# Patient Record
Sex: Female | Born: 1935 | Race: White | Hispanic: No | Smoking: Never smoker
Health system: Southern US, Community
[De-identification: ages and names within clinical notes are randomized; demographics above are authoritative.]

## PROBLEM LIST (undated history)

## (undated) DIAGNOSIS — I4891 Unspecified atrial fibrillation: Secondary | ICD-10-CM

## (undated) DIAGNOSIS — J449 Chronic obstructive pulmonary disease, unspecified: Secondary | ICD-10-CM

## (undated) DIAGNOSIS — F329 Major depressive disorder, single episode, unspecified: Secondary | ICD-10-CM

## (undated) DIAGNOSIS — E119 Type 2 diabetes mellitus without complications: Secondary | ICD-10-CM

## (undated) DIAGNOSIS — K219 Gastro-esophageal reflux disease without esophagitis: Secondary | ICD-10-CM

## (undated) DIAGNOSIS — H409 Unspecified glaucoma: Secondary | ICD-10-CM

## (undated) DIAGNOSIS — I639 Cerebral infarction, unspecified: Secondary | ICD-10-CM

## (undated) DIAGNOSIS — F32A Depression, unspecified: Secondary | ICD-10-CM

## (undated) DIAGNOSIS — K429 Umbilical hernia without obstruction or gangrene: Secondary | ICD-10-CM

## (undated) DIAGNOSIS — I219 Acute myocardial infarction, unspecified: Secondary | ICD-10-CM

## (undated) DIAGNOSIS — I10 Essential (primary) hypertension: Secondary | ICD-10-CM

## (undated) DIAGNOSIS — E785 Hyperlipidemia, unspecified: Secondary | ICD-10-CM

## (undated) DIAGNOSIS — I509 Heart failure, unspecified: Secondary | ICD-10-CM

## (undated) HISTORY — PX: KNEE ARTHROPLASTY: SHX992

## (undated) HISTORY — PX: REPLACEMENT TOTAL KNEE BILATERAL: SUR1225

---

## 2001-09-20 HISTORY — PX: CARDIAC CATHETERIZATION: SHX172

## 2007-04-13 ENCOUNTER — Ambulatory Visit: Payer: Self-pay | Admitting: Internal Medicine

## 2008-08-27 ENCOUNTER — Inpatient Hospital Stay (HOSPITAL_COMMUNITY): Admission: RE | Admit: 2008-08-27 | Discharge: 2008-08-29 | Payer: Self-pay | Admitting: Orthopedic Surgery

## 2009-01-21 ENCOUNTER — Encounter: Admission: RE | Admit: 2009-01-21 | Discharge: 2009-01-21 | Payer: Self-pay | Admitting: Specialist

## 2009-01-22 ENCOUNTER — Encounter: Admission: RE | Admit: 2009-01-22 | Discharge: 2009-01-22 | Payer: Self-pay | Admitting: Specialist

## 2009-01-28 ENCOUNTER — Encounter: Admission: RE | Admit: 2009-01-28 | Discharge: 2009-01-28 | Payer: Self-pay | Admitting: Specialist

## 2009-02-12 ENCOUNTER — Encounter: Admission: RE | Admit: 2009-02-12 | Discharge: 2009-02-12 | Payer: Self-pay | Admitting: Orthopedic Surgery

## 2011-02-02 NOTE — Assessment & Plan Note (Signed)
[facility]                             PULMONARY OFFICE NOTE   NAME:[name], [name]                          MRN:          [id number]  DATE:04/13/2007                            DOB:          [birth year]    PULMONARY CONSULTATION.   PROBLEM:  An 77 year old woman seen on kind referral from Dr. Shary Decamp in  Pulmonary consultation because of cough and bronchitis.   HISTORY:  Episodic bronchitis over several years.  Cough tends to be  slow to resolve after flare-ups and with persistent cough she had a CT  scan.  She brings the disk and we are contacting [facility] for  the radiology report.  My review does not show a major lesion on  available computer screen.  She had another flare of bronchitis very  recently but feels back to her normal baseline as of today.  She has not  been hospitalized for lung disease.  She noticed the shortness of breath  mostly with brisk walking.  Cough is usually dry.  During her most  recent flare she did have a streak of blood transiently, noting that she  was coughing particularly hard and is on Coumadin.  She has a sample of  Spiriva but forgets to use it and is not clear whether it has done her  any good.  She uses Xopenex HFA through an Aerochamber which has made a  big difference in her ability to manipulate it.   MEDICATION:  Spiriva, Xopenex HFA inhaler used p.r.n.  No medication  allergy.  No other medicines listed.   REVIEW OF SYSTEMS:  Exertional dyspnea, nonproductive cough, occasional  palpitation, slight gradual weight loss, rare ankle edema.  She snores.  Occasionally strangles on pills unless she uses applesauce.  Occasionally coughs while eating or drinking but they say not often.  No  heartburn or recognized reflux.  She sleeps comfortably on one pillow  and says her breathing does not wake her at night.   PAST HISTORY:  1. She is not sure about pneumonia but was told once she had mild      asthma  and mild emphysema.  2. Myocardial infarction treated by Dr. Antoine Poche.  3. Elevated cholesterol.  4. Cerebrovascular accident.  5. No deep vein thrombosis.  6. No diabetes.  7. Skin cancers, basal cells, resected from her face.  8. She is not sure about TB exposure to a cousin who had the disease      very many years ago.  What she remembers of tuberculosis skin tests      in the past were negative.   SOCIAL HISTORY:  1. Never smoked herself but husband had been a smoker for years.  2. She is widowed now, living alone.  3. Children are grown.  4. Husband died of adult respiratory distress syndrome and heart      disease.  5. 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   FAMILY HISTORY:  1. Emphysema.  2. Heart disease.  3. One daughter has had diabetes,  stents and myocardial infarction.   OBJECTIVE:  Weight 141 pounds, BP 118/62, pulse 54, room air saturation  95%.  There is a therapeutic scar across the bridge of her nose from old  skin cancer.  She is alert, a little bit overweight.  LUNG SOUNDS:  Are distinctly clear now with no crackles or rales, no  cough demonstrated while here.  Voice quality is normal without stridor.  Her pharynx is clear, there is no cyanosis, clubbing or edema.  HEART SOUNDS:  Regular without murmur heard.   Vaccination:  She has had pneumococcal vaccine once and does get Flu  vaccine.   CT Scan:  On my review, there is some mild bronchiectatic or atelectatic  change at the left base on CT done April 03, 2007, at [facility].  No  distinct mass or pleural effusion, no obvious acute process.  Radiology  report has been requested.   IMPRESSION:  Recurrent bronchitis, probable bronchiectasis.  In someone  of this age, intermittent reflux is always suspected.   PLAN:  1. We are placing a PPD skin test and will track her for report.  2. Okay to use up her current sample of Spiriva and her rescue Xopenex      inhaler, watching to  see if she thinks they make any difference.  3. Schedule pulmonary function test.  4. Schedule return in 1 month.   I appreciate the chance to meet her.  I would be happy to discuss her  care.     Clinton D. Maple Hudson, MD, Tonny Bollman, FACP  Electronically Signed    CDY/MedQ  DD: 04/15/2007  DT: 04/16/2007  Job #: [id number]   cc:   [name], MD

## 2011-02-02 NOTE — H&P (Signed)
[name] [name], ASA                 ACCOUNT NO.:  [id number]   MEDICAL RECORD NO.:  [id number]          PATIENT TYPE:  INP   LOCATION:  NA                           FACILITY:  [facility]   PHYSICIAN:  Madlyn Frankel. Charlann Boxer, M.D.  DATE OF BIRTH:  [birth year]   DATE OF ADMISSION:  08/27/2008  DATE OF DISCHARGE:                              HISTORY & PHYSICAL   PROCEDURE:  Right total knee replacement.   CHIEF COMPLAINT:  Right knee pain.   HISTORY OF PRESENT ILLNESS:  An 77 year old female with a history of  right knee pain secondary to osteoarthritis.  It has been refractory to  all conservative treatment.   PRIMARY CARE PHYSICIAN:  Dr. [name].   CARDIOLOGIST:  Dr. Dulce Sellar.   PAST MEDICAL HISTORY:  Significant for:  1. Osteoarthritis.  2. Hypertension.  3. Stroke.  4. Atrial fibrillation.  5. Diabetes.  6. Dyslipidemia.  7. Emphysema.   PAST SURGICAL HISTORY:  None.   FAMILY HISTORY:  Stroke, heart disease.   SOCIAL HISTORY:  Widowed.  Primary caregiver will be family in the home,  her daughter, [name] [name].   DRUG ALLERGIES:  PENICILLIN.   CURRENT MEDICATIONS:  1. Aspirin 81 mg daily.  2. Citalopram 10 mg p.o. daily.  3. Fosamax 70 mg 1 p.o. weekly.  4. Glipizide 5 mg 1 daily.  5. Lanoxin 12.5 daily.  6. Micardis 40 mg p.o. daily.  7. Nexium 40 mg p.o. daily.  8. Pravachol 40 mg p.o. q.h.s.  9. Spiriva HandiHaler 18 mcg capsule 1 daily.  10.Triamcinolone ointment t.i.d. to q.i.d. p.r.n.  11.Verapamil 80 mg 1 p.o. b.i.d.  12.Warfarin 4 mg daily.  13.Xopenex 45 mcg activated aerosol inhaler 1 to 2 puffs every 4 to 6      hours as needed.  14.Imdur 30 mg p.o. daily, verify dosage with family.   REVIEW OF SYSTEMS:  GENERAL:  She has chills.  RESPIRATORY:  She has  cough.  MUSCULOSKELETAL:  Joint pain, joint swelling, back pain, morning  stiffness.  Otherwise, see HPI.   PHYSICAL EXAMINATION:  Pulse 72.  Respirations 16.  Blood pressure  124/76.  GENERAL:  Awake,  alert and oriented, well developed, well nourished.  HEENT:  Normocephalic.  NECK:  Supple.  No carotid bruits.  CHEST:  Lungs clear to auscultation bilaterally.  BREASTS:  Deferred.  HEART:  Regular rate and rhythm.  S1 and S2 distinct.  ABDOMEN:  Soft, nontender, nondistended.  Bowel sounds present.  PELVIS:  Stable.  GENITOURINARY:  Deferred.  EXTREMITIES:  Right knee has some persistent swelling and flexion  contracture.  SKIN:  No cellulitis.  NEUROLOGIC:  Intact distal sensibilities.   LABORATORY DATA:  Labs, EKG, chest x-ray, all pending presurgical  testing.   PLAN OF ACTION:  Right knee osteoarthritis.   PLAN OF ACTION:  Right total knee replacement at [facility],  August 27, 2008, by surgeon Dr. Durene Romans.  Risks and complications  were discussed both with patient and daughter, [name] [name].   Postoperative medications were provided today.  She is already on  Coumadin.  Provide a prescription for Robaxin, iron, Colace, and  MiraLax.     ______________________________  Yetta Glassman. [address], [place]      Madlyn Frankel. Charlann Boxer, M.D.  Electronically Signed    BLM/MEDQ  D:  08/23/2008  T:  08/23/2008  Job:  [id number]   cc:   [name], MD  Fax: [phone]   Dr. Dulce Sellar

## 2011-02-02 NOTE — Op Note (Signed)
[name] [name] NO.:  [id number]   MEDICAL RECORD NO.:  [id number]          PATIENT TYPE:  INP   LOCATION:  0009                         FACILITY:  [facility]   PHYSICIAN:  Madlyn Frankel. Charlann Boxer, M.D.  DATE OF BIRTH:  12-01-1921   DATE OF PROCEDURE:  08/27/2008  DATE OF DISCHARGE:                               OPERATIVE REPORT   PREOPERATIVE DIAGNOSIS:  Right knee osteoarthritis.   POSTOPERATIVE DIAGNOSIS:  Right knee osteoarthritis.   PROCEDURE:  Right total knee replacement.   COMPONENTS USED:  A DePuy rotating platform posterior stabilized knee  system, size 2.5 femur, 2 tibia, 12.5 insert and a 35 patellar button.   SURGEON:  Madlyn Frankel. Charlann Boxer, M.D.   ASSISTANT:  Dwyane Luo, PA-C   ANESTHESIA:  Spinal.   COMPLICATIONS:  None.   SPECIMENS:  None.   DRAINS:  One Hemovac.   TOURNIQUET TIME:  40 minutes at 250 mmHg.   BLOOD LOSS:  Minimal.   INDICATIONS FOR PROCEDURE:  [name] [name] is an 77 year old female who has  had advanced right knee osteoarthritis.  She had been trying to put off  surgery as much as she possibly could, but her quality of life had  significantly diminished.   Despite the risks that were outlined of infection, DVT, component  failure, need for revision as well as perioperative comorbidity issues,  she wished to proceed with knee replacement for the benefit of pain  relief.  I discussed the postop course expectations.  Consent was  obtained.   PROCEDURE IN DETAIL:  The patient was brought to operative theater.  Once adequate anesthesia, preoperative antibiotics, Ancef administered,  the patient was positioned supine with the right thigh tourniquet  placed.  The right lower extremity was prescrubbed, prepped and draped  in sterile fashion.  Following the time-out identifying the patient and  extremity, the leg was exsanguinated, tourniquet elevated.  Midline  incision was made followed by median partial arthrotomy without a  significant partial medial peel due valgus nature of the knee.  Following initial debridement, attention was directed to the patella.  Precut measure was 22 mm.  I resected down to 14 mm and used a 35  patellar button that fit best on the cut surface of the patella.   Following this, I placed a metal shim on this cut surface to protect the  cut surface from the retractors and the saw blades.   Attention was now directed to the femur.  Following debridement of  meniscus and cruciate stumps, the starting drill opened up the proximal  femur.  I irrigated the canal to prevent fat emboli.  I then placed an  intramedullary rod into the femur and at 3 degrees of valgus I resected  10 mm of bone.  Despite the fact that she had a valgus knee, there was  bone cut of approximately 2 mm made laterally.   Following this distal cut, I attended the tibia.  The tibia subluxated  anteriorly and using extramedullary guide I chose to resect 2 mm bone  off the proximal lateral tibia, the most  affected side.  When this was  done, I checked with my extension block and found the knee was going to  come out to at least extension.  She was still tight lateral.  At this  point I took the opportunity to release the lateral proximal tissues as  well as debrided osteophytes on the lateral distal femur.   I checked the cut surface to make sure my cut was perpendicular and once  I was confident that it was, I set the rotation. I used the rotation  blocks and smooth pins to set the rotation of the femur based on the  proximal cut of the tibia.  These pins were exchanged for 2.5 cutting  block which was noted to be the size of the femur based on the sizing.  The anterior, posterior and chamfer cuts were then made without  difficulty or notching.  Final box cut was made on the lateral aspect of  distal femur.   Following the box cut, attention was redirected to the tibia.  I  determined that the cut surface to the  tibia would be best fit with a  2.5 tibial tray.   It was pinned into the medial third of the tibial tubercle, then drilled  and keel punched.   The trial reduction was now carried out with 2.5 femur, 2.5 tibia and a  10 mm insert.  I felt at this point there was still a little gap opening  medially indicating tightness laterally but also a little bit of  hyperextension.  For this reason I chose a 12.5 insert.  With this, the  knee came to full extension, felt more stable medially and was stable  from extension to flexion.  At this point all trial components were  removed.  We irrigated the knee with normal saline solution.  I injected  the synovial capsule junction with 60 mL of 0.25% Marcaine with  epinephrine and 1 mL of Toradol.   Cement was mixed and the final components were opened.  The final  components were cemented into position.  The knee was brought to  extension with a 12.5 insert.  Extruded cement was removed.   Once the cement was removed and the cement cured, excess cement was  removed from the posterior aspect of the knee.  The final 12.5 insert  was then inserted.  The knee was very stable from extension and flexion  with good tracking of the patella without application of pressure.  A  medium Hemovac drain was placed deep and we reirrigated the knee.  Extensor mechanism was then reapproximated using a #1 Vicryl with the  knee in flexion.  The remainder of the wound was closed using 2-0 Vicryl  and running 4-0 Monocryl.  The knee was cleaned, dried, and dressed  sterilely with a bulky sterile wrap.  The patient was taken to recovery  room in stable condition.     Madlyn Frankel Charlann Boxer, M.D.  Electronically Signed    MDO/MEDQ  D:  08/27/2008  T:  08/27/2008  Job:  [id number]

## 2011-02-05 NOTE — Discharge Summary (Signed)
[name], [name] NO.:  [id number]   MEDICAL RECORD NO.:  [id number]          PATIENT TYPE:  INP   LOCATION:  1603                         FACILITY:  [facility]   PHYSICIAN:  Madlyn Frankel. Charlann Boxer, M.D.  DATE OF BIRTH:  [birth year]   DATE OF ADMISSION:  08/27/2008  DATE OF DISCHARGE:  08/29/2008                               DISCHARGE SUMMARY   ADMITTING DIAGNOSES:  1. Osteoarthritis.  2. Hypertension.  3. Stroke.  4. Atrial fibrillation.  5. Diabetes.  6. Dyslipidemia.  7. Emphysema.   DISCHARGE DIAGNOSES:  1. Osteoarthritis.  2. Hypertension.  3. Stroke.  4. Atrial fibrillation.  5. Diabetes.  6. Dyslipidemia.  7. Emphysema.   HISTORY OF PRESENT ILLNESS:  An 77 year old female with a history of  right knee pain secondary to osteoarthritis.  It was refractory to all  conservative treatment.  Admitted to the hospital for a right total knee  replacement.   PROCEDURE:  A right total knee replacement by surgeon, Dr. Durene Romans,  and assistant, Dwyane Luo P.A.-C.   CONSULTS:  None.   LABORATORY DATA:  CBC, final reading showed her white blood cells 7.1,  hemoglobin 10.4, hematocrit 31.6, and platelets 285.  White cell  differential all within normal limits.  Coags, PT 18.2 and her INR was  1.4.  Metabolic panel, final reading, sodium 129, potassium 3.9, glucose  144, and creatinine 0.6.  Calcium 8.  UA negative.   RADIOLOGY:  Chest, 2 view, showed COPD, bibasilar scarring, no active  disease.   HOSPITAL COURSE:  Patient underwent a right total knee replacement,  tolerated procedure well, admitted to the orthopedic floor.  Her stay  was unremarkable.  She remained hemodynamically and orthopedically  stable throughout her course of stay.  Coumadin was started for DVT  prophylaxis, as well as chronic atrial fibrillation.  There were no  complications during her course of stay.  Physical therapy was  weightbearing as tolerated.  She made good progress with  her therapy.  When we saw her on September 29, 2008, she was stable and improved and  ready for discharge.   DISCHARGE DISPOSITION:  Discharged home with home health care, PT, in  stable and improved condition.   DISCHARGE PHYSICAL THERAPY:  Weightbearing as tolerated with the use of  rolling walker.   DISCHARGE DIET:  Heart healthy.   DISCHARGE WOUND CARE:  Keep dry.   DISCHARGE MEDICATIONS:  1. Coumadin 6 mg for 2 days and then resume 4 mg daily.  2. Robaxin 500 mg p.o. q.6  3. Celebrex 200 mg p.o. b.i.d.  4. Iron 325 mg p.o. t.i.d.  5. Colace 100 mg p.o. b.i.d.  6. MiraLax 17 g p.o. daily.  7. Norco 7.5/325 one to two q. 4 to 6.  8. Citalopram 10 mg p.o. daily.  9. Glipizide 5 mg p.o. daily.  10.Lanoxin 0.125 mg Monday, Wednesday, Friday.  11.Micardis 40 mg p.o. daily.  12.Nexium 40 mg p.o. daily.  13.Pravachol 40 mg p.o. q.h.s.  14.Spiriva daily.  15.Triamcinolone cream p.r.n.  16.Verapamil 80 mg 1 p.o. b.i.d.  DISCHARGE FOLLOWUP:  Follow up with [name] at phone number [phone] in  2 weeks for wound check.     ______________________________  [name] [name]. [name] [name], [place]      Madlyn Frankel. Charlann Boxer, M.D.  Electronically Signed    BLM/MEDQ  D:  10/01/2008  T:  10/01/2008  Job:  [id number]   cc:   [name], MD  Fax: [phone]   Dr. Dulce Sellar

## 2011-05-05 ENCOUNTER — Other Ambulatory Visit (HOSPITAL_COMMUNITY): Payer: Self-pay | Admitting: Orthopedic Surgery

## 2011-05-05 ENCOUNTER — Ambulatory Visit (HOSPITAL_COMMUNITY)
Admission: RE | Admit: 2011-05-05 | Discharge: 2011-05-05 | Disposition: A | Discharge status: Home / Self Care | Payer: Medicare Other | Source: Ambulatory Visit | Attending: Orthopedic Surgery | Admitting: Orthopedic Surgery

## 2011-05-05 ENCOUNTER — Other Ambulatory Visit: Payer: Self-pay | Admitting: Orthopedic Surgery

## 2011-05-05 ENCOUNTER — Encounter (HOSPITAL_COMMUNITY): Payer: Medicare Other

## 2011-05-05 DIAGNOSIS — Z01818 Encounter for other preprocedural examination: Secondary | ICD-10-CM | POA: Insufficient documentation

## 2011-05-05 DIAGNOSIS — Z01812 Encounter for preprocedural laboratory examination: Secondary | ICD-10-CM | POA: Insufficient documentation

## 2011-05-05 DIAGNOSIS — I2510 Atherosclerotic heart disease of native coronary artery without angina pectoris: Secondary | ICD-10-CM | POA: Insufficient documentation

## 2011-05-05 DIAGNOSIS — E785 Hyperlipidemia, unspecified: Secondary | ICD-10-CM | POA: Insufficient documentation

## 2011-05-05 DIAGNOSIS — J90 Pleural effusion, not elsewhere classified: Secondary | ICD-10-CM | POA: Insufficient documentation

## 2011-05-05 DIAGNOSIS — M1710 Unilateral primary osteoarthritis, unspecified knee: Secondary | ICD-10-CM | POA: Insufficient documentation

## 2011-05-05 DIAGNOSIS — J4489 Other specified chronic obstructive pulmonary disease: Secondary | ICD-10-CM | POA: Insufficient documentation

## 2011-05-05 DIAGNOSIS — E119 Type 2 diabetes mellitus without complications: Secondary | ICD-10-CM | POA: Insufficient documentation

## 2011-05-05 DIAGNOSIS — Z01811 Encounter for preprocedural respiratory examination: Secondary | ICD-10-CM

## 2011-05-05 DIAGNOSIS — Z79899 Other long term (current) drug therapy: Secondary | ICD-10-CM | POA: Insufficient documentation

## 2011-05-05 DIAGNOSIS — J449 Chronic obstructive pulmonary disease, unspecified: Secondary | ICD-10-CM | POA: Insufficient documentation

## 2011-05-05 DIAGNOSIS — I252 Old myocardial infarction: Secondary | ICD-10-CM | POA: Insufficient documentation

## 2011-05-05 LAB — URINALYSIS, ROUTINE W REFLEX MICROSCOPIC
Bilirubin Urine: NEGATIVE
Glucose, UA: NEGATIVE mg/dL
Hgb urine dipstick: NEGATIVE
Ketones, ur: NEGATIVE mg/dL
Nitrite: NEGATIVE
pH: 6.5 (ref 5.0–8.0)

## 2011-05-05 LAB — CBC
HCT: 42 % (ref 36.0–46.0)
Hemoglobin: 13.6 g/dL (ref 12.0–15.0)
MCH: 27.5 pg (ref 26.0–34.0)
MCHC: 32.4 g/dL (ref 30.0–36.0)
MCV: 85 fL (ref 78.0–100.0)
Platelets: 364 10*3/uL (ref 150–400)
RBC: 4.94 MIL/uL (ref 3.87–5.11)
RDW: 15 % (ref 11.5–15.5)
WBC: 6.8 10*3/uL (ref 4.0–10.5)

## 2011-05-05 LAB — DIFFERENTIAL
Basophils Absolute: 0 10*3/uL (ref 0.0–0.1)
Basophils Relative: 0 % (ref 0–1)
Eosinophils Absolute: 0.2 10*3/uL (ref 0.0–0.7)
Eosinophils Relative: 2 % (ref 0–5)
Lymphocytes Relative: 28 % (ref 12–46)
Lymphs Abs: 1.9 10*3/uL (ref 0.7–4.0)
Monocytes Absolute: 0.7 10*3/uL (ref 0.1–1.0)
Monocytes Relative: 11 % (ref 3–12)
Neutro Abs: 4 10*3/uL (ref 1.7–7.7)
Neutrophils Relative %: 59 % (ref 43–77)

## 2011-05-05 LAB — BASIC METABOLIC PANEL
BUN: 13 mg/dL (ref 6–23)
GFR calc Af Amer: >60 mL/min (ref >60)
GFR calc non Af Amer: >60 mL/min (ref >60)
Potassium: 4.4 mEq/L (ref 3.5–5.1)

## 2011-05-05 LAB — URINE MICROSCOPIC-ADD ON

## 2011-05-05 LAB — APTT: aPTT: 46 seconds — ABNORMAL HIGH (ref 24–37)

## 2011-05-17 ENCOUNTER — Inpatient Hospital Stay (HOSPITAL_COMMUNITY)
Admission: RE | Admit: 2011-05-17 | Discharge: 2011-05-20 | DRG: 470 | Disposition: A | Discharge status: Home Health | Payer: Medicare Other | Source: Ambulatory Visit | Attending: Orthopedic Surgery | Admitting: Orthopedic Surgery

## 2011-05-17 DIAGNOSIS — Z8673 Personal history of transient ischemic attack (TIA), and cerebral infarction without residual deficits: Secondary | ICD-10-CM

## 2011-05-17 DIAGNOSIS — E119 Type 2 diabetes mellitus without complications: Secondary | ICD-10-CM | POA: Diagnosis present

## 2011-05-17 DIAGNOSIS — M1710 Unilateral primary osteoarthritis, unspecified knee: Principal | ICD-10-CM | POA: Diagnosis present

## 2011-05-17 DIAGNOSIS — Z01812 Encounter for preprocedural laboratory examination: Secondary | ICD-10-CM

## 2011-05-17 DIAGNOSIS — R339 Retention of urine, unspecified: Secondary | ICD-10-CM | POA: Diagnosis not present

## 2011-05-17 DIAGNOSIS — I252 Old myocardial infarction: Secondary | ICD-10-CM

## 2011-05-17 DIAGNOSIS — K219 Gastro-esophageal reflux disease without esophagitis: Secondary | ICD-10-CM | POA: Diagnosis present

## 2011-05-17 DIAGNOSIS — I2510 Atherosclerotic heart disease of native coronary artery without angina pectoris: Secondary | ICD-10-CM | POA: Diagnosis present

## 2011-05-17 DIAGNOSIS — J449 Chronic obstructive pulmonary disease, unspecified: Secondary | ICD-10-CM | POA: Diagnosis present

## 2011-05-17 DIAGNOSIS — K449 Diaphragmatic hernia without obstruction or gangrene: Secondary | ICD-10-CM | POA: Diagnosis present

## 2011-05-17 DIAGNOSIS — I4891 Unspecified atrial fibrillation: Secondary | ICD-10-CM | POA: Diagnosis present

## 2011-05-17 DIAGNOSIS — J4489 Other specified chronic obstructive pulmonary disease: Secondary | ICD-10-CM | POA: Diagnosis present

## 2011-05-17 DIAGNOSIS — E785 Hyperlipidemia, unspecified: Secondary | ICD-10-CM | POA: Diagnosis present

## 2011-05-17 LAB — PROTIME-INR
INR: 1.05 (ref 0.00–1.49)
Prothrombin Time: 13.9 seconds (ref 11.6–15.2)

## 2011-05-17 LAB — TYPE AND SCREEN: Antibody Screen: NEGATIVE

## 2011-05-17 LAB — GLUCOSE, CAPILLARY: Glucose-Capillary: 148 mg/dL — ABNORMAL HIGH (ref 70–99)

## 2011-05-18 LAB — GLUCOSE, CAPILLARY
Glucose-Capillary: 127 mg/dL — ABNORMAL HIGH (ref 70–99)
Glucose-Capillary: 156 mg/dL — ABNORMAL HIGH (ref 70–99)
Glucose-Capillary: 184 mg/dL — ABNORMAL HIGH (ref 70–99)

## 2011-05-18 LAB — BASIC METABOLIC PANEL
BUN: 7 mg/dL (ref 6–23)
CO2: 26 mEq/L (ref 19–32)
Calcium: 8.5 mg/dL (ref 8.4–10.5)
Glucose, Bld: 150 mg/dL — ABNORMAL HIGH (ref 70–99)
Potassium: 3.6 mEq/L (ref 3.5–5.1)

## 2011-05-18 LAB — CBC
HCT: 33.1 % — ABNORMAL LOW (ref 36.0–46.0)
Hemoglobin: 11.2 g/dL — ABNORMAL LOW (ref 12.0–15.0)
MCH: 28.3 pg (ref 26.0–34.0)
MCHC: 33.8 g/dL (ref 30.0–36.0)
RBC: 3.96 MIL/uL (ref 3.87–5.11)

## 2011-05-19 LAB — CBC
Hemoglobin: 10.5 g/dL — ABNORMAL LOW (ref 12.0–15.0)
MCH: 27.5 pg (ref 26.0–34.0)
MCHC: 32.7 g/dL (ref 30.0–36.0)
Platelets: 267 10*3/uL (ref 150–400)
RDW: 15 % (ref 11.5–15.5)

## 2011-05-19 LAB — GLUCOSE, CAPILLARY
Glucose-Capillary: 220 mg/dL — ABNORMAL HIGH (ref 70–99)
Glucose-Capillary: 144 mg/dL — ABNORMAL HIGH (ref 70–99)

## 2011-05-19 LAB — BASIC METABOLIC PANEL
CO2: 27 mEq/L (ref 19–32)
Chloride: 96 mEq/L (ref 96–112)
Glucose, Bld: 158 mg/dL — ABNORMAL HIGH (ref 70–99)
Potassium: 3.9 mEq/L (ref 3.5–5.1)
Sodium: 129 mEq/L — ABNORMAL LOW (ref 135–145)

## 2011-05-20 LAB — GLUCOSE, CAPILLARY
Glucose-Capillary: 184 mg/dL — ABNORMAL HIGH (ref 70–99)
Glucose-Capillary: 159 mg/dL — ABNORMAL HIGH (ref 70–99)

## 2011-05-21 NOTE — Op Note (Signed)
NAMEKEYLIE, BEAVERS NO.:  [id number]  MEDICAL RECORD NO.:  [id number]  LOCATION:  1612                         FACILITY:  [facility]  PHYSICIAN:  Madlyn Frankel. Charlann Boxer, M.D.  DATE OF BIRTH:  [birth year]  DATE OF PROCEDURE:  05/17/2011 DATE OF DISCHARGE:                              OPERATIVE REPORT   PREOPERATIVE DIAGNOSIS:  Left knee osteoarthritis.  POSTOPERATIVE DIAGNOSIS:  Left knee osteoarthritis.  PROCEDURE:  Left total knee replacement.  COMPONENTS USED:  DePuy rotating platform posterior stabilized knee system with size 2-1/2 femur, 2-1/2 tibia, 10-mm insert and a 35 patellar button.  SURGEON:  Madlyn Frankel. Charlann Boxer, M.D.  ASSISTANT:  Lanney Gins, PA  ANESTHESIA:  General plus a preoperative regional femoral nerve block.  SPECIMENS:  None.  COMPLICATIONS:  None.  DRAINS:  One Hemovac.  TOURNIQUET TIME:  33 minutes at 250 mmHg.  INDICATION FOR PROCEDURE:  [name] is an 77 year old female, who presented to the office with bilateral knee osteoarthritis.  She had successfully undergone a right total knee replacement.  At this point, she was ready to proceed with a left knee replacement.  Risks and benefits were discussed and reviewed as well as a postoperative course and expectations.  Questions were answered.  Consent was obtained for benefit of pain relief.  PROCEDURE IN DETAIL:  The patient was brought to the operative theater. Once adequate anesthesia, preoperative antibiotics, vancomycin administered due to MRSA swab, nasal swab.  She was positioned supine with left thigh tourniquet placed.  Left lower extremity was then prepped and draped in sterile fashion with left foot placed into the [facility] leg holder.  A time-out was performed identifying the patient, planned procedure and extremity.  Leg was exsanguinated, tourniquet elevated to 250 mmHg.  A midline incision was made followed by median parapatellar arthrotomy.  Following initial  exposure of the proximal tibia as well as debridement of synovium and fat pad, attention was first directed to the patella.  The patella was measured to be 22 mm.  I resected down the 14 mm using a 35 patellar button to restore patella thickness and cover  the cut surface.  Lug holes were drilled and a metal shim placed to protect the patella from retractors and saw blades.  At this point, attention was directed to femur.  Femoral canal was opened.  The drill irrigated to try to prevent fat emboli.  An intramedullary rod was passed at 3 degrees of valgus, 10 mm of bone were resected off the distal femur.  Following this resection, the tibia was subluxed anteriorly and following removal of medial and lateral meniscus, the extramedullary guide was positioned and approximately 6 mm of bone resected based off the proximal lateral tibia.  Following this resection, we confirmed the gap would be stable at the 10- mm insert.  I then sized the femur to be a size 2-1/2 femur.  Size 2-1/2 rotation block was then pinned into position anterior reference using the C-clamp set rotation.  The anterior, posterior, and chamfer cuts were then made and a final box cut made up the lateral aspect of the distal femur without complications.  Tibia was then subluxated  anteriorly and cut surface was best fit with size 2-1/2 tibial tray.  The tray was pinned into position through the medial third of the tubercle drilled and keel punched.  Trial reduction was carried out with a 10-mm insert.  The knee came to full extension. The patella tracked through the trochlea without application of pressure.  Given these findings, the trial components removed.  The final components were opened, the cement was mixed.  Final components were cemented onto clean and dried cut surfaces of bone.  The extruded cement was removed from the knee.  Once the cement had fully cured, excessive cement was removed throughout the  knee.  The final 10-mm insert was chosen and placed in the knee.  The tourniquet had been let down after 33 minutes without significant hemostasis required.  A medium Hemovac drain was placed deep.  The extensor mechanism was then reapproximated over a Hemovac drain using #1 Vicryl. The knee in flexion.  The remaining of the wound was closed with 2-0 Vicryl and running 4-0 Monocryl.  The knee was cleaned, dried, and dressed sterilely using Dermabond and Aquacel dressing.  Drain site dressed separately.  The patient was brought to recovery room with an Ace wrap over the knee in stable condition , tolerating the procedure well.     Madlyn Frankel Charlann Boxer, M.D.     MDO/MEDQ  D:  05/17/2011  T:  05/17/2011  Job:  [id number]  Electronically Signed by Durene Romans M.D. on 05/21/2011 09:03:32 AM

## 2011-05-21 NOTE — Discharge Summary (Signed)
NAMENOMA, QUIJAS NO.:  [id number]  MEDICAL RECORD NO.:  [id number]  LOCATION:  1612                         FACILITY:  [facility]  PHYSICIAN:  Madlyn Frankel. Charlann Boxer, M.D.  DATE OF BIRTH:  27-Dec-1921  DATE OF ADMISSION:  05/17/2011 DATE OF DISCHARGE:  05/19/2011                              DISCHARGE SUMMARY   PROCEDURE:  Left total knee arthroplasty.  ATTENDING PHYSICIAN:  Madlyn Frankel. Charlann Boxer, M.D.  ADMITTING DIAGNOSIS:  Left knee osteoarthritis.  DISCHARGE DIAGNOSES: 1. Status post left total knee arthroplasty. 2. Chronic obstructive  pulmonary disease. 3. Atrial fibrillation. 4. Borderline diabetes. 5. Rheumatoid arthritis. 6. Hiatal hernia with reflux. 7. Previous heart attack. 8. Previous small stroke. 9. Hyperlipidemia. 10.Coronary artery disease.  HISTORY OF PRESENT ILLNESS:  The patient is an 77 year old female complaining of left knee pain.  The patient states the pain has been going off and on for about 10 years.  The patient did have a previous right total knee arthroplasty in 2009.  X-rays recently over the left knee shows significant arthritic changes.  The patient has received steroid injections in the past, which have helped some, but only for a while and the duration has decreased since receiving them.  Various options were discussed with the patient.  The patient wished to proceed with surgery.  Risks, benefits and expectations of the procedure were discussed with the patient.  The patient understood the risks, benefits, and expectations and wished to proceed with surgery.  HOSPITAL CHART:  Postop day 1, May 10, 2011, the patient is doing well.  No complaints, afebrile, vital signs stable.  H and H is 11.2/32.1.  Dressing good, clean, dry and intact.  Distally neurovascularly intact.  The patient's Hemovac was discontinued.  IV was changed to saline lock.  The patient had physical therapy.  Postop day 2,  May 19, 2011, the patient is  doing well.  No incidences.  The patient does state she is ready to go home.  The patient did have difficulty urinating overnight, but did urinate this morning.  H and H is 10.5/32.1.  Afebrile, vital signs stable.  Dressing good, clean, dry, and intact.  Distally neurovascularly intact.  The patient was transferred this morning.  The patient does seem out of it with some pain medicine.  Pain medicine was not lowered on discharge. The patient will be working with physical therapy today.  As long as she does well she is good enough to be discharged home.  DISCHARGE CONDITION:  Okay.  DISCHARGE INSTRUCTIONS:  The patient will be discharged home with Home Health, PT, and weightbearing as tolerated.  The patient will maintain her surgical dressing for 7 to 8 days after which time she will be changed with gauze and tape.  This area should stay dry and clean until followup.  The patient will follow up with [name] in 2 weeks at [facility].  The patient knows to call with any questions or concerns.  DISCHARGE MEDICATIONS: 1. Benadryl 25 mg one p.o. q.4 hours p.r.n. 2. Colace 100 mg one p.o. b.i.d. constipation. 3. Iron sulfate 325 mg one p.o. t.i.d. for 2 to 3 weeks. 4. Norco  5/325 one to two p.o. q.4-6 hours p.r.n. pain (the patient     does know she can take this in half, one pill seems too much). 5. Robaxin 500 mg one p.o. q.6 hours p.r.n. muscle spasms. 6. MiraLax 17 g p.o. q. day constipation. 7. __________ 10 mg one p.o. q.a.m. 8. Fish oil one p.o. every Monday, Wednesday, and Friday. 9. Furosemide 40 mg half tablet p.o. every other day. 10.Glipizide 5 mg one p.o. b.i.d. 11.Isosorbide mononitrate XR 60 mg one p.o. q.a.m. 12.Digoxin 0.125 mg one p.o. q.a.m. every Monday, Wednesday, and     Friday. 13.Lumigan 1 drop, both eyes q.h.s. 14.Pradaxa 150 mg one p.o. b.i.d. 15.Pravachol 40 mg one p.o. q.h.s. 16.Saline spray, one  spray nasally p.r.n. 17.Spiriva 18 mcg 1  capsule inhaled daily. 18.Systane 1 to 2 drops in both eyes twice daily as needed. 19.Verapamil 80 mg one p.o. b.i.d. 20.Xopenex inhaler 1 to 2 puffs q.6 hours p.r.n.    ______________________________ Lanney Gins, PA   ______________________________ Madlyn Frankel. Charlann Boxer, M.D.    MB/MEDQ  D:  05/19/2011  T:  05/19/2011  Job:  [id number]  Electronically Signed by Lanney Gins PA on 05/20/2011 11:26:22 AM Electronically Signed by Durene Romans M.D. on 05/21/2011 09:03:38 AM

## 2011-06-04 NOTE — Discharge Summary (Signed)
  NAMEJOVON, [name] [name] NO.:  [id number]  MEDICAL RECORD NO.:  [id number]  LOCATION:  1612                         FACILITY:  [facility]  PHYSICIAN:  Madlyn Frankel. Charlann Boxer, M.D.  DATE OF BIRTH:  01-09-1922  DATE OF ADMISSION:  05/17/2011 DATE OF DISCHARGE:  05/20/2011                              DISCHARGE SUMMARY   ADDENDUM:  After the patient had physical therapy on May 19, 2011, it was felt that the patient may be unsafe to herself to go home.  After discussion with the daughter and mother, the patient stayed 1 extra night.  After physical therapy on May 20, 2011, PT, daughter, and the patient all felt that she was safer to be discharged home.  The patient will be discharged home on May 20, 2011 with Home Health Physical Therapy and still followup at [facility] 2 weeks postop.    ______________________________ Lanney Gins, PA   ______________________________ Madlyn Frankel. Charlann Boxer, M.D.    MB/MEDQ  D:  05/20/2011  T:  05/20/2011  Job:  [id number]  Electronically Signed by Lanney Gins PA on 06/01/2011 03:55:56 PM Electronically Signed by Durene Romans M.D. on 06/04/2011 07:24:46 AM

## 2011-06-04 NOTE — H&P (Signed)
[name] [name], [name] [name] NO.:  [id number]  MEDICAL RECORD NO.:  [id number]  LOCATION:  [facility]M                         FACILITY:  [facility]  PHYSICIAN:  Madlyn Frankel. Charlann Boxer, M.D.  DATE OF BIRTH:  27-Feb-1922  DATE OF ADMISSION:  05/05/2011 DATE OF DISCHARGE:  05/05/2011                             HISTORY & PHYSICAL   DATE OF SURGERY:  May 17, 2011.  DIAGNOSIS:  Left knee osteoarthritis.  HISTORY OF PRESENT ILLNESS:  The patient is an 77 year old female complaining of left knee pain.  The patient says the pain has been going off and on for about 10 years.  The patient did have a previous right total knee arthroplasty in 2009.  X-rays recently of her left knee show a significant osteoarthritis.  The patient has received a steroid injection in the past which have helped some, but only for a while and the duration has decreased since receiving them.  Various options were discussed with the patient.  The patient wished to proceed with surgery. Risks, benefits, and expectations of procedure were discussed with the patient.  The patient understands risks, benefits, and expectations and wished to proceed with surgery.  The patient's primary care physician is Dr. [name], MD.  The patient also sees Dr. Leary Roca in [facility].  PAST MEDICAL HISTORY: 1. COPD. 2. Atrial fib. 3. Borderline diabetes. 4. RA. 5. Hiatal hernia with reflux. 6. Previous light heart attack. 7. Previous small stroke. 8. Hyperlipidemia. 9. CAD.  PAST SURGICAL HISTORY:  Vertebroplasty.  MEDICATIONS: 1. Citalopram 10 mg 1 p.o. daily. 2. Digoxin 0.125 mg one p.o. on Mondays, Wednesdays, and Fridays. 3. Lasix 40 mg half a pill on Monday, Wednesday, Fridays. 4. Glipizide 5 mg one p.o. b.i.d. 5. Isosorbide control release 60 mg one p.o. daily. 6. Phenergan 0.03% ophthalmic solution 1 drop both eyes once daily in     the evenings. 7. Nexium 40 mg one p.o. daily 8. Nitrolingual  Durapak 0.4 mg/spray solution, one spray under the     tongue as needed for chest pain. 9. Pradaxa 150 mg one p.o. b.i.d.  The patient will stop this 48 hours     prior to surgery.  This also will be started 23 hours after     surgery. 10.Pravastatin 40 mg one p.o. q.h.s. 11.Spiriva HandiHaler 18 mcg inhalation capsule inhaled one daily. 12.Triamcinolone 0.1% external ointment to affected area 3-4 times     daily. 13.Verapamil 80 mg one p.o. b.i.d. 14.Xopenex HFA 45 mcg/ACT inhalation inhale 1-2 puffs every 4-6 hours     as needed.  ALLERGIES:  PENICILLINS.  SOCIAL HISTORY:  The patient denies use of alcohol or tobacco.  REVIEW OF SYSTEMS:  The patient does admit to some hearing loss and some insomnia at times.  Also complaining of joint pain, joint swelling, and morning stiffness.  Otherwise review of systems is unremarkable.  PHYSICAL EXAMINATION:  GENERAL:  The patient is an 77 year old female with no acute distress. VITAL SIGNS:  Stable.  Blood pressure is 140/84, respirations 16, pulse 60. HEENT:  Pupils are equal, round, and reactive to light and accommodation.  Throat is clear. NECK: Supple.  No JVD.  There are no carotid bruits.  No lymphadenopathy noted. CARDIAC:  S1, S2.  No murmur appreciated. RESPIRATORY:  Lungs clear to auscultation bilaterally. NEUROLOGIC:  The patient is alert and oriented x3 MUSCULOSKELETAL:  Ortho is pertaining to the left knee.  The patient has no real pain on palpation.  The patient does have a retropatellar crepitus on range of motion.  The patient is lacking couple degrees of full extension.  The patient has a good flexion.  The patient has a valgus deformity to the knee.  The patient has a posterior dorsalis pedis pulse.  Distally neurovascularly intact.  Good sensation to light touch.  Surgical site looks clear.  IMPRESSION:  Left knee osteoarthritis.  STUDIES:  X-rays as above.  PLAN:  The patient admitted to the hospital to undergo  a left total knee replacement per [name] on May 17, 2011.  Risks, benefits, and expectations of procedure discussed with the patient.  The patient understands risks, benefits, and expectations and wished to proceed with surgery.    ______________________________ Lanney Gins, PA   ______________________________ Madlyn Frankel. Charlann Boxer, M.D.    MB/MEDQ  D:  05/05/2011  T:  05/06/2011  Job:  [id number]  Electronically Signed by Lanney Gins PA on 06/01/2011 03:56:12 PM Electronically Signed by Durene Romans M.D. on 06/04/2011 07:24:50 AM

## 2011-06-25 LAB — URINALYSIS, ROUTINE W REFLEX MICROSCOPIC
Bilirubin Urine: NEGATIVE
Hgb urine dipstick: NEGATIVE
Ketones, ur: NEGATIVE mg/dL
Protein, ur: NEGATIVE mg/dL
Specific Gravity, Urine: 1.005 (ref 1.005–1.030)
Urobilinogen, UA: 0.2 mg/dL (ref 0.0–1.0)

## 2011-06-25 LAB — BASIC METABOLIC PANEL
BUN: 7 mg/dL (ref 6–23)
CO2: 26 mEq/L (ref 19–32)
CO2: 26 mEq/L (ref 19–32)
CO2: 28 mEq/L (ref 19–32)
Calcium: 8 mg/dL — ABNORMAL LOW (ref 8.4–10.5)
Calcium: 9.4 mg/dL (ref 8.4–10.5)
Chloride: 100 mEq/L (ref 96–112)
Chloride: 99 mEq/L (ref 96–112)
Creatinine, Ser: 0.6 mg/dL (ref 0.4–1.2)
GFR calc Af Amer: >60 mL/min (ref >60)
GFR calc Af Amer: >60 mL/min (ref >60)
GFR calc non Af Amer: >60 mL/min (ref >60)
Glucose, Bld: 106 mg/dL — ABNORMAL HIGH (ref 70–99)
Glucose, Bld: 144 mg/dL — ABNORMAL HIGH (ref 70–99)
Potassium: 3.9 mEq/L (ref 3.5–5.1)
Potassium: 4 mEq/L (ref 3.5–5.1)
Sodium: 129 mEq/L — ABNORMAL LOW (ref 135–145)
Sodium: 132 mEq/L — ABNORMAL LOW (ref 135–145)
Sodium: 138 mEq/L (ref 135–145)

## 2011-06-25 LAB — CBC
HCT: 31.6 % — ABNORMAL LOW (ref 36.0–46.0)
Hemoglobin: 10.4 g/dL — ABNORMAL LOW (ref 12.0–15.0)
Hemoglobin: 10.7 g/dL — ABNORMAL LOW (ref 12.0–15.0)
Hemoglobin: 13 g/dL (ref 12.0–15.0)
MCHC: 33.4 g/dL (ref 30.0–36.0)
MCHC: 33.6 g/dL (ref 30.0–36.0)
MCV: 87.7 fL (ref 78.0–100.0)
RBC: 3.58 MIL/uL — ABNORMAL LOW (ref 3.87–5.11)
RBC: 3.63 MIL/uL — ABNORMAL LOW (ref 3.87–5.11)
RDW: 13.9 % (ref 11.5–15.5)
RDW: 14 % (ref 11.5–15.5)

## 2011-06-25 LAB — APTT: aPTT: 37 seconds (ref 24–37)

## 2011-06-25 LAB — DIFFERENTIAL
Basophils Absolute: 0 10*3/uL (ref 0.0–0.1)
Basophils Relative: 1 % (ref 0–1)
Eosinophils Relative: 1 % (ref 0–5)
Monocytes Absolute: 0.7 10*3/uL (ref 0.1–1.0)
Neutro Abs: 4.7 10*3/uL (ref 1.7–7.7)

## 2011-06-25 LAB — GLUCOSE, CAPILLARY
Glucose-Capillary: 112 mg/dL — ABNORMAL HIGH (ref 70–99)
Glucose-Capillary: 123 mg/dL — ABNORMAL HIGH (ref 70–99)
Glucose-Capillary: 127 mg/dL — ABNORMAL HIGH (ref 70–99)
Glucose-Capillary: 137 mg/dL — ABNORMAL HIGH (ref 70–99)
Glucose-Capillary: 141 mg/dL — ABNORMAL HIGH (ref 70–99)

## 2011-06-25 LAB — PROTIME-INR
INR: 1 (ref 0.00–1.49)
INR: 1.1 (ref 0.00–1.49)
INR: 1.4 (ref 0.00–1.49)
Prothrombin Time: 13.9 seconds (ref 11.6–15.2)

## 2013-09-29 ENCOUNTER — Encounter (HOSPITAL_COMMUNITY): Payer: Self-pay | Admitting: Emergency Medicine

## 2013-09-29 ENCOUNTER — Emergency Department (HOSPITAL_COMMUNITY): Payer: Medicare PPO

## 2013-09-29 ENCOUNTER — Emergency Department (HOSPITAL_COMMUNITY)
Admission: EM | Admit: 2013-09-29 | Discharge: 2013-09-30 | Disposition: A | Discharge status: Home / Self Care | Payer: Medicare PPO | Attending: Emergency Medicine | Admitting: Emergency Medicine

## 2013-09-29 DIAGNOSIS — S42202A Unspecified fracture of upper end of left humerus, initial encounter for closed fracture: Secondary | ICD-10-CM

## 2013-09-29 DIAGNOSIS — F29 Unspecified psychosis not due to a substance or known physiological condition: Secondary | ICD-10-CM | POA: Insufficient documentation

## 2013-09-29 DIAGNOSIS — S42209A Unspecified fracture of upper end of unspecified humerus, initial encounter for closed fracture: Secondary | ICD-10-CM | POA: Insufficient documentation

## 2013-09-29 DIAGNOSIS — Z7901 Long term (current) use of anticoagulants: Secondary | ICD-10-CM | POA: Insufficient documentation

## 2013-09-29 DIAGNOSIS — W010XXA Fall on same level from slipping, tripping and stumbling without subsequent striking against object, initial encounter: Secondary | ICD-10-CM | POA: Insufficient documentation

## 2013-09-29 DIAGNOSIS — S42009A Fracture of unspecified part of unspecified clavicle, initial encounter for closed fracture: Secondary | ICD-10-CM | POA: Insufficient documentation

## 2013-09-29 DIAGNOSIS — I4891 Unspecified atrial fibrillation: Secondary | ICD-10-CM | POA: Insufficient documentation

## 2013-09-29 DIAGNOSIS — S62319A Displaced fracture of base of unspecified metacarpal bone, initial encounter for closed fracture: Secondary | ICD-10-CM | POA: Insufficient documentation

## 2013-09-29 DIAGNOSIS — S20219A Contusion of unspecified front wall of thorax, initial encounter: Secondary | ICD-10-CM | POA: Insufficient documentation

## 2013-09-29 DIAGNOSIS — S42002A Fracture of unspecified part of left clavicle, initial encounter for closed fracture: Secondary | ICD-10-CM

## 2013-09-29 DIAGNOSIS — S62305A Unspecified fracture of fourth metacarpal bone, left hand, initial encounter for closed fracture: Secondary | ICD-10-CM

## 2013-09-29 DIAGNOSIS — Y92009 Unspecified place in unspecified non-institutional (private) residence as the place of occurrence of the external cause: Secondary | ICD-10-CM | POA: Insufficient documentation

## 2013-09-29 DIAGNOSIS — Y9389 Activity, other specified: Secondary | ICD-10-CM | POA: Insufficient documentation

## 2013-09-29 HISTORY — DX: Acute myocardial infarction, unspecified: I21.9

## 2013-09-29 HISTORY — DX: Type 2 diabetes mellitus without complications: E11.9

## 2013-09-29 HISTORY — DX: Cerebral infarction, unspecified: I63.9

## 2013-09-29 HISTORY — DX: Chronic obstructive pulmonary disease, unspecified: J44.9

## 2013-09-29 HISTORY — DX: Unspecified glaucoma: H40.9

## 2013-09-29 HISTORY — DX: Essential (primary) hypertension: I10

## 2013-09-29 HISTORY — DX: Umbilical hernia without obstruction or gangrene: K42.9

## 2013-09-29 HISTORY — DX: Unspecified atrial fibrillation: I48.91

## 2013-09-29 LAB — URINALYSIS, ROUTINE W REFLEX MICROSCOPIC
GLUCOSE, UA: 100 mg/dL — AB
HGB URINE DIPSTICK: NEGATIVE
Ketones, ur: NEGATIVE mg/dL
Leukocytes, UA: NEGATIVE
Nitrite: NEGATIVE
Protein, ur: NEGATIVE mg/dL
SPECIFIC GRAVITY, URINE: 1.027 (ref 1.005–1.030)
Urobilinogen, UA: 0.2 mg/dL (ref 0.0–1.0)
pH: 6 (ref 5.0–8.0)

## 2013-09-29 MED ORDER — TRAMADOL HCL 50 MG PO TABS
50.0000 mg | ORAL_TABLET | Freq: Once | ORAL | Status: AC
  Administered 2013-09-29: 50 mg via ORAL
  Filled 2013-09-29: qty 1

## 2013-09-29 MED ORDER — TRAMADOL HCL 50 MG PO TABS: 50.0000 mg | ORAL_TABLET | Freq: Four times a day (QID) | ORAL | Status: DC | PRN

## 2013-09-29 NOTE — ED Notes (Addendum)
Patient tolerating fluids well.

## 2013-09-29 NOTE — Discharge Instructions (Signed)
Your urine tests are negative for infection. For shoulder/proximal humerus/clavicle fracture - wear splint/shoulder immobilizer.  For wrist, wear splint.  Ice/coldpack to sore area.  If percocet is too strong - hold/stop that medication.  Instead your may try ultram as need for pain.  Use great care/caution or assistance when up and ambulating.   Follow up with your orthopedist in the coming week - call office Monday morning to arrange that follow up. If you feel you require some additional help at home while your injuries heal, contact your doctors office in morning to discuss home health options.   As the risk of adverse bleeding relating to trauma/fall appears to outweigh the benefit of continued anti-coagulant therapy, hold/stop taking your pradaxa.  Contact your doctor in the next day to discuss whether to discontinue pradaxa permanently given your recent falls and ongoing fall risk.   Return to ER right away if worse, new symptoms, fevers, change in mental status, numbness/weakness, other concern.    Fall Prevention and Home Safety Falls cause injuries and can affect all age groups. It is possible to use preventive measures to significantly decrease the likelihood of falls. There are many simple measures which can make your home safer and prevent falls. OUTDOORS  Repair cracks and edges of walkways and driveways.  Remove high doorway thresholds.  Trim shrubbery on the main path into your home.  Have good outside lighting.  Clear walkways of tools, rocks, debris, and clutter.  Check that handrails are not broken and are securely fastened. Both sides of steps should have handrails.  Have leaves, snow, and ice cleared regularly.  Use sand or salt on walkways during winter months.  In the garage, clean up grease or oil spills. BATHROOM  Install night lights.  Install grab bars by the toilet and in the tub and shower.  Use non-skid mats or decals in the tub or  shower.  Place a plastic non-slip stool in the shower to sit on, if needed.  Keep floors dry and clean up all water on the floor immediately.  Remove soap buildup in the tub or shower on a regular basis.  Secure bath mats with non-slip, double-sided rug tape.  Remove throw rugs and tripping hazards from the floors. BEDROOMS  Install night lights.  Make sure a bedside light is easy to reach.  Do not use oversized bedding.  Keep a telephone by your bedside.  Have a firm chair with side arms to use for getting dressed.  Remove throw rugs and tripping hazards from the floor. KITCHEN  Keep handles on pots and pans turned toward the center of the stove. Use back burners when possible.  Clean up spills quickly and allow time for drying.  Avoid walking on wet floors.  Avoid hot utensils and knives.  Position shelves so they are not too high or low.  Place commonly used objects within easy reach.  If necessary, use a sturdy step stool with a grab bar when reaching.  Keep electrical cables out of the way.  Do not use floor polish or wax that makes floors slippery. If you must use wax, use non-skid floor wax.  Remove throw rugs and tripping hazards from the floor. STAIRWAYS  Never leave objects on stairs.  Place handrails on both sides of stairways and use them. Fix any loose handrails. Make sure handrails on both sides of the stairways are as long as the stairs.  Check carpeting to make sure it is firmly attached along stairs.  Make repairs to worn or loose carpet promptly.  Avoid placing throw rugs at the top or bottom of stairways, or properly secure the rug with carpet tape to prevent slippage. Get rid of throw rugs, if possible.  Have an electrician put in a light switch at the top and bottom of the stairs. OTHER FALL PREVENTION TIPS  Wear low-heel or rubber-soled shoes that are supportive and fit well. Wear closed toe shoes.  When using a stepladder, make sure it  is fully opened and both spreaders are firmly locked. Do not climb a closed stepladder.  Add color or contrast paint or tape to grab bars and handrails in your home. Place contrasting color strips on first and last steps.  Learn and use mobility aids as needed. Install an electrical emergency response system.  Turn on lights to avoid dark areas. Replace light bulbs that burn out immediately. Get light switches that glow.  Arrange furniture to create clear pathways. Keep furniture in the same place.  Firmly attach carpet with non-skid or double-sided tape.  Eliminate uneven floor surfaces.  Select a carpet pattern that does not visually hide the edge of steps.  Be aware of all pets. OTHER HOME SAFETY TIPS  Set the water temperature for 120 F (48.8 C).  Keep emergency numbers on or near the telephone.  Keep smoke detectors on every level of the home and near sleeping areas. Document Released: 08/27/2002 Document Revised: 03/07/2012 Document Reviewed: 11/26/2011 [facility] Patient Information 2014 [place], [place].     Shoulder Fracture (Proximal Humerus or Glenoid) A shoulder fracture is a broken upper arm bone or a broken socket bone. The humerus is the upper arm bone and the glenoid is the shoulder socket. Proximal means the humerus is broken near the shoulder. Most of the time the bones of a broken shoulder are in an acceptable position. Usually, the injury can be treated with a shoulder immobilizer or sling and swath bandage. These devices support the arm and prevent any shoulder movement. If the bones are not in a good position, then surgery is sometimes needed. Shoulder fractures usually initially cause swelling, pain, and discoloration around the upper arm. They heal in 8 to 12 weeks with proper treatment. SYMPTOMS  At the time of injury:  Pain.  Tenderness.  Regular body contours are not normal. Later symptoms may include:  Swelling and bruising of the elbow and  hand.  Swelling and bruising of the arm or chest. Other symptoms include:  Pain when lifting or turning the arm.  Paralysis below the fracture.  Numbness or coldness below the fracture. CAUSES   Indirect force from falling on an outstretched arm.  A blow to the shoulder. RISK INCREASES WITH:  Not being in shape.  Playing contact sports, such as football, soccer, hockey, or rugby.  Sports where falling on an outstretched arm occurs, such as basketball, skateboarding, or volleyball.  History of bone or joint disease.  History of shoulder injury. PREVENTION  Warm up before activity.  Stretch before activity.  Stay in shape with your:  Heart fitness.  Flexibility.  Shoulder Strength.  Falling with the proper technique. PROGNOSIS  In adults, healing time is about 7 weeks. For children, healing time is about 5 weeks. Surgery may be needed. RELATED COMPLICATIONS  The bones do not heal together (nonunion).  The bones do not align properly when they heal (malunion).  Long-term problems with pain, stiffness, swelling, or loss of motion.  The injured arm heals shorter than the  other.  Nerves are injured in the arm.  Arthritis in the shoulder.  Normal bone growth is interrupted in children.  Blood supply to the shoulder joint is diminished. TREATMENT If the bones are aligned, then initial treatment will be with ice and medicine to help with pain. The shoulder will be held in place with a sling (immobilization). The shoulder will be allowed to heal for up to 6 weeks. Injuries that may need surgery include:  Severe fractures.  Fractures that are not in appropriate alignment (displaced).  Non-displaced fractures (not common). Surgery helps the bones align correctly. The bones may be held in place with:  Sutures.  Wires.  Rods.  Plates.  Screws.  Pins. If you have had surgery or not, you will likely be assisted by a physical therapist or athletic  trainer to get the best results with your injured shoulder. This will likely include exercises to strengthen and stretch the injured and surrounding areas. MEDICATION  If pain medicine is needed, nonsteroidal anti-inflammatory medicines (such as aspirin or ibuprofen) or other minor pain relievers (such as acetaminophen) are often advised.  Do not take pain medicine for 7 days before surgery.  Stronger pain relievers may be prescribed. Use only as directed and take only as much as you need. COLD THERAPY Cold treatment (icing) relieves pain and reduces inflammation. Cold treatment should be applied for 10 to 15 minutes every 2 to 3 hours, and immediately after activity that aggravates your symptoms. Use ice packs or an ice massage. SEEK IMMEDIATE MEDICAL CARE IF:  You have severe shoulder pain unrelieved by rest and taking pain medicine.  You have pain, numbness, tingling, or weakness in the hand or wrist.  You have shortness of breath, chest pain, severe weakness, or fainting.  You have severe pain with motion of the fingers or wrist.  Blue, gray, or dark color appears in the fingernails on injured extremity. Document Released: 09/06/2005 Document Revised: 11/29/2011 Document Reviewed: 12/19/2008 [facility] Patient Information 2014 [place], [place].    Clavicle Fracture A clavicle fracture is a break in the collarbone. This is a common injury, especially in children. Collarbones do not harden until around the age of 24. Most collarbone fractures are treated with a simple arm sling. In some cases a figure-of-eight splint is used to help hold the broken bones in position. Although not often needed, surgery may be required if the bone fragments are not in the correct position (displaced).  HOME CARE INSTRUCTIONS   Apply ice to the injury for 15-20 minutes each hour while awake for 2 days. Put the ice in a plastic bag and place a towel between the bag of ice and your skin.  Wear the sling or  splint constantly for as long as directed by your caregiver. You may remove the sling or splint for bathing or showering. Be sure to keep your shoulder in the same place as when the sling or splint is on. Do not lift your arm.  If a figure-of-eight splint is applied, it must be tightened by another person every day. Tighten it enough to keep the shoulders held back. Allow enough room to place the index finger between the body and strap. Loosen the splint immediately if you feel numbness or tingling in your hands.  Only take over-the-counter or prescription medicines for pain, discomfort, or fever as directed by your caregiver.  Avoid activities that irritate or increase the pain for 4 to 6 weeks after surgery.  Follow all instructions for follow-up  with your caregiver. This includes any referrals, physical therapy, and rehabilitation. Any delay in obtaining necessary care could result in a delay or failure of the injury to heal properly. SEEK MEDICAL CARE IF:  You have pain and swelling that are not relieved with medications. SEEK IMMEDIATE MEDICAL CARE IF:  Your arm is numb, cold, or pale, even when the splint is loose. MAKE SURE YOU:   Understand these instructions.  Will watch your condition.  Will get help right away if you are not doing well or get worse. Document Released: 06/16/2005 Document Revised: 11/29/2011 Document Reviewed: 04/11/2008 [facility] Patient Information 2014 [place], [place].   Hand Fracture, Metacarpals Fractures of metacarpals are breaks in the bones of the hand. They extend from the knuckles to the wrist. These bones can undergo many types of fractures. There are different ways of treating these fractures, all of which may be correct. TREATMENT  Hand fractures can be treated with:   Non-reduction - The fracture is casted without changing the positions of the fracture (bone pieces) involved. This fracture is usually left in a cast for 4 to 6 weeks or as your  caregiver thinks necessary.  Closed reduction - The bones are moved back into position without surgery and then casted.  ORIF (open reduction and internal fixation) - The fracture site is opened and the bone pieces are fixed into place with some type of hardware, such as screws, etc. They are then casted. Your caregiver will discuss the type of fracture you have and the treatment that should be best for that problem. If surgery is chosen, let your caregivers know about the following.  LET YOUR CAREGIVERS KNOW ABOUT:  Allergies.  Medications you are taking, including herbs, eye drops, over the counter medications, and creams.  Use of steroids (by mouth or creams).  Previous problems with anesthetics or novocaine.  Possibility of pregnancy.  History of blood clots (thrombophlebitis).  History of bleeding or blood problems.  Previous surgeries.  Other health problems. AFTER THE PROCEDURE After surgery, you will be taken to the recovery area where a nurse will watch and check your progress. Once you are awake, stable, and taking fluids well, barring other problems, you'll be allowed to go home. Once home, an ice pack applied to your operative site may help with pain and keep the swelling down. HOME CARE INSTRUCTIONS   Follow your caregiver's instructions as to activities, exercises, physical therapy, and driving a car.  Daily exercise is helpful for keeping range of motion and strength. Exercise as instructed.  To lessen swelling, keep the injured hand elevated above the level of your heart as much as possible.  Apply ice to the injury for 15-20 minutes each hour while awake for the first 2 days. Put the ice in a plastic bag and place a thin towel between the bag of ice and your cast.  Move the fingers of your casted hand several times a day.  If a plaster or fiberglass cast was applied:  Do not try to scratch the skin under the cast using a sharp or pointed object.  Check the  skin around the cast every day. You may put lotion on red or sore areas.  Keep your cast dry. Your cast can be protected during bathing with a plastic bag. Do not put your cast into the water.  If a plaster splint was applied:  Wear your splint for as long as directed by your caregiver or until seen again.  Do not  get your splint wet. Protect it during bathing with a plastic bag.  You may loosen the elastic bandage around the splint if your fingers start to get numb, tingle, get cold or turn blue.  Do not put pressure on your cast or splint; this may cause it to break. Especially, do not lean plaster casts on hard surfaces for 24 hours after application.  Take medications as directed by your caregiver.  Only take over-the-counter or prescription medicines for pain, discomfort, or fever as directed by your caregiver.  Follow-up as provided by your caregiver. This is very important in order to avoid permanent injury or disability and chronic pain. SEEK MEDICAL CARE IF:   Increased bleeding (more than a small spot) from beneath your cast or splint if there is beneath the cast as with an open reduction.  Redness, swelling, or increasing pain in the wound or from beneath your cast or splint.  Pus coming from wound or from beneath your cast or splint.  An unexplained oral temperature above 102 F (38.9 C) develops, or as your caregiver suggests.  A foul smell coming from the wound or dressing or from beneath your cast or splint.  You have a problem moving any of your fingers. SEEK IMMEDIATE MEDICAL CARE IF:   You develop a rash  You have difficulty breathing  You have any allergy problems If you do not have a window in your cast for observing the wound, a discharge or minor bleeding may show up as a stain on the outside of your cast. Report these findings to your caregiver. MAKE SURE YOU:   Understand these instructions.  Will watch your condition.  Will get help right away  if you are not doing well or get worse. Document Released: 09/06/2005 Document Revised: 11/29/2011 Document Reviewed: 04/25/2008 [facility] Patient Information 2014 [place], [place].    Cryotherapy Cryotherapy means treatment with cold. Ice or gel packs can be used to reduce both pain and swelling. Ice is the most helpful within the first 24 to 48 hours after an injury or flareup from overusing a muscle or joint. Sprains, strains, spasms, burning pain, shooting pain, and aches can all be eased with ice. Ice can also be used when recovering from surgery. Ice is effective, has very few side effects, and is safe for most people to use. PRECAUTIONS  Ice is not a safe treatment option for people with:  Raynaud's phenomenon. This is a condition affecting small blood vessels in the extremities. Exposure to cold may cause your problems to return.  Cold hypersensitivity. There are many forms of cold hypersensitivity, including:  Cold urticaria. Red, itchy hives appear on the skin when the tissues begin to warm after being iced.  Cold erythema. This is a red, itchy rash caused by exposure to cold.  Cold hemoglobinuria. Red blood cells break down when the tissues begin to warm after being iced. The hemoglobin that carry oxygen are passed into the urine because they cannot combine with blood proteins fast enough.  Numbness or altered sensitivity in the area being iced. If you have any of the following conditions, do not use ice until you have discussed cryotherapy with your caregiver:  Heart conditions, such as arrhythmia, angina, or chronic heart disease.  High blood pressure.  Healing wounds or open skin in the area being iced.  Current infections.  Rheumatoid arthritis.  Poor circulation.  Diabetes. Ice slows the blood flow in the region it is applied. This is beneficial when trying to  stop inflamed tissues from spreading irritating chemicals to surrounding tissues. However, if you expose  your skin to cold temperatures for too long or without the proper protection, you can damage your skin or nerves. Watch for signs of skin damage due to cold. HOME CARE INSTRUCTIONS Follow these tips to use ice and cold packs safely.  Place a dry or damp towel between the ice and skin. A damp towel will cool the skin more quickly, so you may need to shorten the time that the ice is used.  For a more rapid response, add gentle compression to the ice.  Ice for no more than 10 to 20 minutes at a time. The bonier the area you are icing, the less time it will take to get the benefits of ice.  Check your skin after 5 minutes to make sure there are no signs of a poor response to cold or skin damage.  Rest 20 minutes or more in between uses.  Once your skin is numb, you can end your treatment. You can test numbness by very lightly touching your skin. The touch should be so light that you do not see the skin dimple from the pressure of your fingertip. When using ice, most people will feel these normal sensations in this order: cold, burning, aching, and numbness.  Do not use ice on someone who cannot communicate their responses to pain, such as small children or people with dementia. HOW TO MAKE AN ICE PACK Ice packs are the most common way to use ice therapy. Other methods include ice massage, ice baths, and cryo-sprays. Muscle creams that cause a cold, tingly feeling do not offer the same benefits that ice offers and should not be used as a substitute unless recommended by your caregiver. To make an ice pack, do one of the following:  Place crushed ice or a bag of frozen vegetables in a sealable plastic bag. Squeeze out the excess air. Place this bag inside another plastic bag. Slide the bag into a pillowcase or place a damp towel between your skin and the bag.  Mix 3 parts water with 1 part rubbing alcohol. Freeze the mixture in a sealable plastic bag. When you remove the mixture from the freezer, it  will be slushy. Squeeze out the excess air. Place this bag inside another plastic bag. Slide the bag into a pillowcase or place a damp towel between your skin and the bag. SEEK MEDICAL CARE IF:  You develop white spots on your skin. This may give the skin a blotchy (mottled) appearance.  Your skin turns blue or pale.  Your skin becomes waxy or hard.  Your swelling gets worse. MAKE SURE YOU:   Understand these instructions.  Will watch your condition.  Will get help right away if you are not doing well or get worse. Document Released: 05/03/2011 Document Revised: 11/29/2011 Document Reviewed: 05/03/2011 [facility] Patient Information 2014 [place], [place].

## 2013-09-29 NOTE — ED Provider Notes (Addendum)
CSN: [id number]     Arrival date & time 09/29/13  1947 History   First MD Initiated Contact with Patient 09/29/13 2001     Chief Complaint  Patient presents with  . Fall   (Consider location/radiation/quality/duration/timing/severity/associated sxs/prior Treatment) Patient is a 80 y.o. female presenting with fall. The history is provided by the patient and a relative.  Fall Pertinent negatives include no chest pain, no abdominal pain, no headaches and no shortness of breath.  pt with hx afib, s/p fall yesterday at home. Was leaning over to look at something when fell to side. Was [facility] same, was dx with proximal humerus fracture. Today family also noted left wrist pain and bruising to left upper chest wall - they states they [place], and were instructed to come to ED here. No recurrent fall since yesterday. Pts family states seems generally weak, not as verbal as yesterday, but states they have given her percocet for pain today. Is on pradaxa for hx afib. No other abn bleeding or bruising. w fall yesterday, deny loc. No neck or back pain. No fever or chills.     No past medical history on file. No past surgical history on file. No family history on file. History  Substance Use Topics  . Smoking status: Not on file  . Smokeless tobacco: Not on file  . Alcohol Use: Not on file   OB History   No data available     Review of Systems  Constitutional: Negative for fever and chills.  HENT: Negative for sore throat.   Eyes: Negative for redness.  Respiratory: Negative for cough and shortness of breath.   Cardiovascular: Negative for chest pain.  Gastrointestinal: Negative for vomiting and abdominal pain.  Genitourinary: Negative for dysuria and flank pain.  Musculoskeletal: Negative for back pain and neck pain.  Skin: Negative for rash.  Neurological: Negative for weakness, numbness and headaches.  Hematological:       On  pradaxa, bruising to chest wall  Psychiatric/Behavioral: Positive for confusion.    Allergies  Review of patient's allergies indicates not on file.  Home Medications  No current outpatient prescriptions on file. BP 119/55  Pulse 55  Temp(Src) 98.4 F (36.9 C) (Oral)  Resp 20  SpO2 89% Physical Exam  Nursing note and vitals reviewed. Constitutional: She appears well-developed and well-nourished. No distress.  HENT:  Head: Atraumatic.  Mouth/Throat: Oropharynx is clear and moist.  Eyes: Conjunctivae are normal. Pupils are equal, round, and reactive to light. No scleral icterus.  Neck: Neck supple. No tracheal deviation present.  No bruit  Cardiovascular: Normal rate and normal heart sounds.   Pulmonary/Chest: Effort normal and breath sounds normal. No respiratory distress.  Tenderness left upper chest wall, left clavicle. Normal chest movement, excursion.   Abdominal: Soft. Normal appearance. She exhibits no distension. There is no tenderness.  Genitourinary:  No cva tenderness  Musculoskeletal: She exhibits no edema.  Left shoulder in immobilizer. Radial pulse 2+. Tenderness left wrist and prox left hand diffusely. Good rom bil ext, no other focal bony tenderness noted. CTLS spine, non tender, aligned, no step off.   Neurological: She is alert. No cranial nerve deficit.  Alert, oriented to person, place, day. Speech clear/fluent. Motor intact bil. R/u/m n fxn intact left UE.   Skin: Skin is warm and dry. No rash noted.  Psychiatric: She has a normal mood and affect.    ED Course  Procedures (including critical care  time)   Dg Wrist Complete Left  09/29/2013   CLINICAL DATA:  Left wrist pain after fall.  EXAM: LEFT WRIST - COMPLETE 3+ VIEW  COMPARISON:  None.  FINDINGS: Mildly displaced oblique fracture of proximal portion of 4th metacarpal is noted. Narrowing and sclerosis of the 1st carpometacarpal joint is noted. Soft tissues are unremarkable.  IMPRESSION: Osteoarthritis  of 1st carpometacarpal joint. Mildly displaced fracture of 4th metacarpal.   Electronically Signed   By: Roque Lias M.D.   On: 09/29/2013 21:35   Ct Head Wo Contrast  09/29/2013   CLINICAL DATA:  Altered mental status after fall.  EXAM: CT HEAD WITHOUT CONTRAST  TECHNIQUE: Contiguous axial images were obtained from the base of the skull through the vertex without intravenous contrast.  COMPARISON:  CT scan of same day.  FINDINGS: Bony calvarium appears intact. Diffuse cortical atrophy is noted. Chronic ischemic white matter disease is noted. Ventricular size is within normal limits. No mass effect or midline shift is noted. There is no evidence of mass lesion, hemorrhage or acute infarction.  IMPRESSION: Diffuse cortical atrophy. Chronic ischemic white matter disease. No acute intracranial abnormality seen.   Electronically Signed   By: Roque Lias M.D.   On: 09/29/2013 21:46     EKG Interpretation   None       MDM  Xrays.  Reviewed nursing notes and prior charts for additional history.   Reviewed films on disc from yesterday, prox left humerus fx, left clavicle fx.   Ct head neg acute.  Pt alert, content.  On todays wrist films, proximal left 4th metacarpal fx, closed. Ulnar gutter splint by ortho tech. Pt in splint/sling/shoulder immobilizer. Lives w family who can assist her.  Family notes other falls in past couple months. Given recurrent falls, feel any benefit pradaxa/afib, outweighed by risk falls/injury, will rec hold/d/c and follow up w pcp Monday.   Family indicates pt w ?mild dysuria and request ua be checked, ua added to workup.  Requests pain med in ed for hand/wrist when being moved for splint.  Ultram po.  ua neg for uti.   Pt appears stable for d/c.      Suzi Roots, MD 09/29/13 [phone]

## 2013-09-29 NOTE — ED Notes (Signed)
Pt states while standing she fell onto L side, pt c/o pain to L wrist as well as new bruising to L chest wall. [facility] seen [facility] night for L humeral neck fx.

## 2013-09-29 NOTE — ED Notes (Signed)
Patient granddaughter is concerned because the patient is now complaining of left wrist and new brushing on chest. Patient is currently on pradaxa for hx of A-fib.

## 2015-10-14 ENCOUNTER — Inpatient Hospital Stay (HOSPITAL_COMMUNITY): Payer: Medicare Other | Admitting: Certified Registered"

## 2015-10-14 ENCOUNTER — Inpatient Hospital Stay (HOSPITAL_COMMUNITY)
Admission: EM | Admit: 2015-10-14 | Discharge: 2015-10-23 | DRG: 480 | Disposition: A | Discharge status: Skilled Nursing Facility | Payer: Medicare Other | Attending: Internal Medicine | Admitting: Internal Medicine

## 2015-10-14 ENCOUNTER — Emergency Department (HOSPITAL_COMMUNITY): Payer: Medicare Other

## 2015-10-14 ENCOUNTER — Encounter (HOSPITAL_COMMUNITY)
Admission: EM | Disposition: A | Discharge status: Skilled Nursing Facility | Payer: Self-pay | Source: Home / Self Care | Attending: Internal Medicine

## 2015-10-14 ENCOUNTER — Encounter (HOSPITAL_COMMUNITY): Payer: Self-pay

## 2015-10-14 DIAGNOSIS — D62 Acute posthemorrhagic anemia: Secondary | ICD-10-CM | POA: Diagnosis not present

## 2015-10-14 DIAGNOSIS — D72829 Elevated white blood cell count, unspecified: Secondary | ICD-10-CM | POA: Diagnosis not present

## 2015-10-14 DIAGNOSIS — S72002Q Fracture of unspecified part of neck of left femur, subsequent encounter for open fracture type I or II with malunion: Secondary | ICD-10-CM | POA: Diagnosis not present

## 2015-10-14 DIAGNOSIS — E785 Hyperlipidemia, unspecified: Secondary | ICD-10-CM | POA: Diagnosis present

## 2015-10-14 DIAGNOSIS — Z515 Encounter for palliative care: Secondary | ICD-10-CM | POA: Insufficient documentation

## 2015-10-14 DIAGNOSIS — I4891 Unspecified atrial fibrillation: Secondary | ICD-10-CM | POA: Diagnosis not present

## 2015-10-14 DIAGNOSIS — J439 Emphysema, unspecified: Secondary | ICD-10-CM | POA: Diagnosis not present

## 2015-10-14 DIAGNOSIS — R1310 Dysphagia, unspecified: Secondary | ICD-10-CM | POA: Diagnosis not present

## 2015-10-14 DIAGNOSIS — E119 Type 2 diabetes mellitus without complications: Secondary | ICD-10-CM

## 2015-10-14 DIAGNOSIS — I10 Essential (primary) hypertension: Secondary | ICD-10-CM | POA: Diagnosis not present

## 2015-10-14 DIAGNOSIS — Z96653 Presence of artificial knee joint, bilateral: Secondary | ICD-10-CM | POA: Diagnosis not present

## 2015-10-14 DIAGNOSIS — S72402A Unspecified fracture of lower end of left femur, initial encounter for closed fracture: Secondary | ICD-10-CM

## 2015-10-14 DIAGNOSIS — W010XXA Fall on same level from slipping, tripping and stumbling without subsequent striking against object, initial encounter: Secondary | ICD-10-CM | POA: Diagnosis not present

## 2015-10-14 DIAGNOSIS — I6330 Cerebral infarction due to thrombosis of unspecified cerebral artery: Secondary | ICD-10-CM

## 2015-10-14 DIAGNOSIS — R471 Dysarthria and anarthria: Secondary | ICD-10-CM | POA: Diagnosis not present

## 2015-10-14 DIAGNOSIS — I82402 Acute embolism and thrombosis of unspecified deep veins of left lower extremity: Secondary | ICD-10-CM | POA: Diagnosis not present

## 2015-10-14 DIAGNOSIS — I2699 Other pulmonary embolism without acute cor pulmonale: Secondary | ICD-10-CM | POA: Insufficient documentation

## 2015-10-14 DIAGNOSIS — I482 Chronic atrial fibrillation: Secondary | ICD-10-CM

## 2015-10-14 DIAGNOSIS — J189 Pneumonia, unspecified organism: Secondary | ICD-10-CM

## 2015-10-14 DIAGNOSIS — Z880 Allergy status to penicillin: Secondary | ICD-10-CM

## 2015-10-14 DIAGNOSIS — E860 Dehydration: Secondary | ICD-10-CM | POA: Diagnosis not present

## 2015-10-14 DIAGNOSIS — S7292XA Unspecified fracture of left femur, initial encounter for closed fracture: Secondary | ICD-10-CM | POA: Diagnosis present

## 2015-10-14 DIAGNOSIS — Z419 Encounter for procedure for purposes other than remedying health state, unspecified: Secondary | ICD-10-CM

## 2015-10-14 DIAGNOSIS — I639 Cerebral infarction, unspecified: Secondary | ICD-10-CM | POA: Insufficient documentation

## 2015-10-14 DIAGNOSIS — W19XXXA Unspecified fall, initial encounter: Secondary | ICD-10-CM | POA: Diagnosis present

## 2015-10-14 DIAGNOSIS — I5032 Chronic diastolic (congestive) heart failure: Secondary | ICD-10-CM | POA: Diagnosis present

## 2015-10-14 DIAGNOSIS — J449 Chronic obstructive pulmonary disease, unspecified: Secondary | ICD-10-CM

## 2015-10-14 DIAGNOSIS — F329 Major depressive disorder, single episode, unspecified: Secondary | ICD-10-CM | POA: Diagnosis not present

## 2015-10-14 DIAGNOSIS — S72402B Unspecified fracture of lower end of left femur, initial encounter for open fracture type I or II: Secondary | ICD-10-CM | POA: Diagnosis not present

## 2015-10-14 DIAGNOSIS — E873 Alkalosis: Secondary | ICD-10-CM | POA: Diagnosis not present

## 2015-10-14 DIAGNOSIS — Z86711 Personal history of pulmonary embolism: Secondary | ICD-10-CM | POA: Diagnosis not present

## 2015-10-14 DIAGNOSIS — R5383 Other fatigue: Secondary | ICD-10-CM | POA: Diagnosis not present

## 2015-10-14 DIAGNOSIS — Z7901 Long term (current) use of anticoagulants: Secondary | ICD-10-CM | POA: Diagnosis not present

## 2015-10-14 DIAGNOSIS — M9712XA Periprosthetic fracture around internal prosthetic left knee joint, initial encounter: Principal | ICD-10-CM | POA: Diagnosis present

## 2015-10-14 DIAGNOSIS — J9601 Acute respiratory failure with hypoxia: Secondary | ICD-10-CM | POA: Diagnosis not present

## 2015-10-14 DIAGNOSIS — Z09 Encounter for follow-up examination after completed treatment for conditions other than malignant neoplasm: Secondary | ICD-10-CM

## 2015-10-14 DIAGNOSIS — R410 Disorientation, unspecified: Secondary | ICD-10-CM

## 2015-10-14 DIAGNOSIS — Z7984 Long term (current) use of oral hypoglycemic drugs: Secondary | ICD-10-CM | POA: Diagnosis not present

## 2015-10-14 DIAGNOSIS — I6340 Cerebral infarction due to embolism of unspecified cerebral artery: Secondary | ICD-10-CM

## 2015-10-14 DIAGNOSIS — Z66 Do not resuscitate: Secondary | ICD-10-CM | POA: Diagnosis present

## 2015-10-14 DIAGNOSIS — Z79899 Other long term (current) drug therapy: Secondary | ICD-10-CM | POA: Diagnosis not present

## 2015-10-14 DIAGNOSIS — I63439 Cerebral infarction due to embolism of unspecified posterior cerebral artery: Secondary | ICD-10-CM | POA: Diagnosis not present

## 2015-10-14 DIAGNOSIS — R630 Anorexia: Secondary | ICD-10-CM | POA: Diagnosis not present

## 2015-10-14 DIAGNOSIS — H409 Unspecified glaucoma: Secondary | ICD-10-CM | POA: Diagnosis present

## 2015-10-14 DIAGNOSIS — S7292XB Unspecified fracture of left femur, initial encounter for open fracture type I or II: Secondary | ICD-10-CM

## 2015-10-14 DIAGNOSIS — I509 Heart failure, unspecified: Secondary | ICD-10-CM

## 2015-10-14 DIAGNOSIS — Z823 Family history of stroke: Secondary | ICD-10-CM | POA: Diagnosis not present

## 2015-10-14 DIAGNOSIS — K219 Gastro-esophageal reflux disease without esophagitis: Secondary | ICD-10-CM | POA: Diagnosis present

## 2015-10-14 DIAGNOSIS — I480 Paroxysmal atrial fibrillation: Secondary | ICD-10-CM

## 2015-10-14 DIAGNOSIS — R0902 Hypoxemia: Secondary | ICD-10-CM

## 2015-10-14 DIAGNOSIS — Z789 Other specified health status: Secondary | ICD-10-CM | POA: Diagnosis not present

## 2015-10-14 HISTORY — DX: Unspecified glaucoma: H40.9

## 2015-10-14 HISTORY — PX: I&D EXTREMITY: SHX5045

## 2015-10-14 HISTORY — DX: Heart failure, unspecified: I50.9

## 2015-10-14 HISTORY — DX: Depression, unspecified: F32.A

## 2015-10-14 HISTORY — DX: Major depressive disorder, single episode, unspecified: F32.9

## 2015-10-14 HISTORY — DX: Gastro-esophageal reflux disease without esophagitis: K21.9

## 2015-10-14 HISTORY — DX: Hyperlipidemia, unspecified: E78.5

## 2015-10-14 LAB — LIPID PANEL
CHOL/HDL RATIO: 4.4 ratio
Cholesterol: 172 mg/dL (ref 0–200)
HDL: 39 mg/dL — ABNORMAL LOW (ref >40)
LDL CALC: 121 mg/dL — AB (ref 0–99)
Triglycerides: 61 mg/dL (ref <150)
VLDL: 12 mg/dL (ref 0–40)

## 2015-10-14 LAB — COMPREHENSIVE METABOLIC PANEL
ALBUMIN: 3.5 g/dL (ref 3.5–5.0)
ALT: 11 U/L — AB (ref 14–54)
ALT: 12 U/L — AB (ref 14–54)
AST: 17 U/L (ref 15–41)
AST: 18 U/L (ref 15–41)
Albumin: 3 g/dL — ABNORMAL LOW (ref 3.5–5.0)
Alkaline Phosphatase: 66 U/L (ref 38–126)
Alkaline Phosphatase: 54 U/L (ref 38–126)
Anion gap: 12 (ref 5–15)
Anion gap: 12 (ref 5–15)
BILIRUBIN TOTAL: 0.7 mg/dL (ref 0.3–1.2)
BUN: 12 mg/dL (ref 6–20)
BUN: 12 mg/dL (ref 6–20)
CHLORIDE: 101 mmol/L (ref 101–111)
CO2: 22 mmol/L (ref 22–32)
CO2: 24 mmol/L (ref 22–32)
CREATININE: 0.62 mg/dL (ref 0.44–1.00)
CREATININE: 0.73 mg/dL (ref 0.44–1.00)
Calcium: 8.9 mg/dL (ref 8.9–10.3)
Calcium: 8.4 mg/dL — ABNORMAL LOW (ref 8.9–10.3)
Chloride: 99 mmol/L — ABNORMAL LOW (ref 101–111)
GFR calc Af Amer: >60 mL/min (ref >60)
GFR calc non Af Amer: >60 mL/min (ref >60)
GLUCOSE: 252 mg/dL — AB (ref 65–99)
Glucose, Bld: 296 mg/dL — ABNORMAL HIGH (ref 65–99)
POTASSIUM: 4.2 mmol/L (ref 3.5–5.1)
Potassium: 4.4 mmol/L (ref 3.5–5.1)
SODIUM: 135 mmol/L (ref 135–145)
Sodium: 135 mmol/L (ref 135–145)
TOTAL PROTEIN: 5.7 g/dL — AB (ref 6.5–8.1)
Total Bilirubin: 0.4 mg/dL (ref 0.3–1.2)
Total Protein: 6.5 g/dL (ref 6.5–8.1)

## 2015-10-14 LAB — BRAIN NATRIURETIC PEPTIDE: B Natriuretic Peptide: 141.5 pg/mL — ABNORMAL HIGH (ref 0.0–100.0)

## 2015-10-14 LAB — PROTIME-INR
INR: 1.11 (ref 0.00–1.49)
Prothrombin Time: 14.5 seconds (ref 11.6–15.2)

## 2015-10-14 LAB — I-STAT CHEM 8, ED
BUN: 15 mg/dL (ref 6–20)
CREATININE: 0.6 mg/dL (ref 0.44–1.00)
Calcium, Ion: 1.17 mmol/L (ref 1.13–1.30)
Chloride: 101 mmol/L (ref 101–111)
GLUCOSE: 249 mg/dL — AB (ref 65–99)
HCT: 42 % (ref 36.0–46.0)
HEMOGLOBIN: 14.3 g/dL (ref 12.0–15.0)
Potassium: 4.3 mmol/L (ref 3.5–5.1)
Sodium: 136 mmol/L (ref 135–145)
TCO2: 25 mmol/L (ref 0–100)

## 2015-10-14 LAB — APTT: APTT: 30 s (ref 24–37)

## 2015-10-14 LAB — CBC
HCT: 40.1 % (ref 36.0–46.0)
HCT: 36.1 % (ref 36.0–46.0)
Hemoglobin: 12.3 g/dL (ref 12.0–15.0)
Hemoglobin: 10.9 g/dL — ABNORMAL LOW (ref 12.0–15.0)
MCH: 23.4 pg — ABNORMAL LOW (ref 26.0–34.0)
MCH: 23.6 pg — AB (ref 26.0–34.0)
MCHC: 30.7 g/dL (ref 30.0–36.0)
MCHC: 30.2 g/dL (ref 30.0–36.0)
MCV: 76.8 fL — AB (ref 78.0–100.0)
MCV: 77.5 fL — ABNORMAL LOW (ref 78.0–100.0)
PLATELETS: 273 10*3/uL (ref 150–400)
Platelets: 354 10*3/uL (ref 150–400)
RBC: 4.66 MIL/uL (ref 3.87–5.11)
RBC: 5.22 MIL/uL — AB (ref 3.87–5.11)
RDW: 16.5 % — ABNORMAL HIGH (ref 11.5–15.5)
RDW: 16.5 % — AB (ref 11.5–15.5)
WBC: 10.9 10*3/uL — ABNORMAL HIGH (ref 4.0–10.5)
WBC: 11.4 10*3/uL — AB (ref 4.0–10.5)

## 2015-10-14 LAB — GLUCOSE, CAPILLARY
GLUCOSE-CAPILLARY: 285 mg/dL — AB (ref 65–99)
GLUCOSE-CAPILLARY: 266 mg/dL — AB (ref 65–99)
GLUCOSE-CAPILLARY: 265 mg/dL — AB (ref 65–99)
GLUCOSE-CAPILLARY: 255 mg/dL — AB (ref 65–99)
GLUCOSE-CAPILLARY: 231 mg/dL — AB (ref 65–99)

## 2015-10-14 LAB — ABO/RH: ABO/RH(D): O POS

## 2015-10-14 LAB — I-STAT TROPONIN, ED: TROPONIN I, POC: 0.01 ng/mL (ref 0.00–0.08)

## 2015-10-14 LAB — ETHANOL: Alcohol, Ethyl (B): <5 mg/dL (ref <5)

## 2015-10-14 SURGERY — IRRIGATION AND DEBRIDEMENT EXTREMITY
Anesthesia: General | Site: Leg Upper | Laterality: Left

## 2015-10-14 MED ORDER — ACETAMINOPHEN 325 MG PO TABS: 325.0000 mg | ORAL_TABLET | ORAL | Status: DC | PRN

## 2015-10-14 MED ORDER — SODIUM CHLORIDE 0.9 % IV SOLN: INTRAVENOUS | Status: DC

## 2015-10-14 MED ORDER — ONDANSETRON HCL 4 MG/2ML IJ SOLN: 4.0000 mg | Freq: Four times a day (QID) | INTRAMUSCULAR | Status: DC | PRN

## 2015-10-14 MED ORDER — FENTANYL CITRATE (PF) 100 MCG/2ML IJ SOLN
50.0000 ug | Freq: Once | INTRAMUSCULAR | Status: AC
  Administered 2015-10-14: 50 ug via INTRAVENOUS
  Filled 2015-10-14: qty 2

## 2015-10-14 MED ORDER — MORPHINE SULFATE (PF) 2 MG/ML IV SOLN: 0.5000 mg | INTRAVENOUS | Status: DC | PRN

## 2015-10-14 MED ORDER — OXYCODONE-ACETAMINOPHEN 5-325 MG PO TABS: 1.0000 | ORAL_TABLET | ORAL | Status: DC | PRN

## 2015-10-14 MED ORDER — ONDANSETRON HCL 4 MG/2ML IJ SOLN
INTRAMUSCULAR | Status: AC
  Filled 2015-10-14: qty 2

## 2015-10-14 MED ORDER — INSULIN ASPART 100 UNIT/ML ~~LOC~~ SOLN
6.0000 [IU] | Freq: Once | SUBCUTANEOUS | Status: AC
  Administered 2015-10-14: 6 [IU] via SUBCUTANEOUS

## 2015-10-14 MED ORDER — LATANOPROST 0.005 % OP SOLN
1.0000 [drp] | Freq: Every day | OPHTHALMIC | Status: DC
  Filled 2015-10-14: qty 2.5

## 2015-10-14 MED ORDER — EPHEDRINE SULFATE 50 MG/ML IJ SOLN
INTRAMUSCULAR | Status: DC | PRN
  Administered 2015-10-14: 10 mg via INTRAVENOUS

## 2015-10-14 MED ORDER — ONDANSETRON HCL 4 MG PO TABS: 4.0000 mg | ORAL_TABLET | Freq: Four times a day (QID) | ORAL | Status: DC | PRN

## 2015-10-14 MED ORDER — LIDOCAINE HCL (CARDIAC) 20 MG/ML IV SOLN
INTRAVENOUS | Status: AC
  Filled 2015-10-14: qty 5

## 2015-10-14 MED ORDER — NITROGLYCERIN 0.4 MG SL SUBL: 0.4000 mg | SUBLINGUAL_TABLET | SUBLINGUAL | Status: DC | PRN

## 2015-10-14 MED ORDER — DEXAMETHASONE SODIUM PHOSPHATE 4 MG/ML IJ SOLN
INTRAMUSCULAR | Status: DC | PRN
  Administered 2015-10-14: 4 mg via INTRAVENOUS

## 2015-10-14 MED ORDER — METHOCARBAMOL 500 MG PO TABS: 500.0000 mg | ORAL_TABLET | Freq: Four times a day (QID) | ORAL | Status: DC | PRN

## 2015-10-14 MED ORDER — DEXAMETHASONE SODIUM PHOSPHATE 4 MG/ML IJ SOLN
INTRAMUSCULAR | Status: AC
  Filled 2015-10-14: qty 1

## 2015-10-14 MED ORDER — OXYCODONE HCL 5 MG/5ML PO SOLN: 5.0000 mg | Freq: Once | ORAL | Status: DC | PRN

## 2015-10-14 MED ORDER — LACTATED RINGERS IV SOLN
INTRAVENOUS | Status: DC | PRN
  Administered 2015-10-14: 04:00:00 via INTRAVENOUS

## 2015-10-14 MED ORDER — INSULIN ASPART 100 UNIT/ML ~~LOC~~ SOLN
SUBCUTANEOUS | Status: AC
  Filled 2015-10-14: qty 6

## 2015-10-14 MED ORDER — INSULIN ASPART 100 UNIT/ML ~~LOC~~ SOLN
0.0000 [IU] | Freq: Every day | SUBCUTANEOUS | Status: DC
  Administered 2015-10-14: 2 [IU] via SUBCUTANEOUS

## 2015-10-14 MED ORDER — ACETAMINOPHEN 325 MG PO TABS: 650.0000 mg | ORAL_TABLET | Freq: Four times a day (QID) | ORAL | Status: DC | PRN

## 2015-10-14 MED ORDER — GLYCOPYRROLATE 0.2 MG/ML IJ SOLN
INTRAMUSCULAR | Status: AC
  Filled 2015-10-14: qty 1

## 2015-10-14 MED ORDER — METHOCARBAMOL 1000 MG/10ML IJ SOLN
500.0000 mg | Freq: Four times a day (QID) | INTRAVENOUS | Status: DC | PRN
  Administered 2015-10-14: 500 mg via INTRAVENOUS
  Filled 2015-10-14 (×3): qty 5

## 2015-10-14 MED ORDER — POLYVINYL ALCOHOL 1.4 % OP SOLN: 1.0000 [drp] | Freq: Four times a day (QID) | OPHTHALMIC | Status: DC | PRN

## 2015-10-14 MED ORDER — CITALOPRAM HYDROBROMIDE 10 MG PO TABS: 10.0000 mg | ORAL_TABLET | Freq: Every day | ORAL | Status: DC

## 2015-10-14 MED ORDER — SUCCINYLCHOLINE CHLORIDE 20 MG/ML IJ SOLN
INTRAMUSCULAR | Status: AC
  Filled 2015-10-14: qty 1

## 2015-10-14 MED ORDER — EPHEDRINE SULFATE 50 MG/ML IJ SOLN
INTRAMUSCULAR | Status: AC
  Filled 2015-10-14: qty 1

## 2015-10-14 MED ORDER — BUPIVACAINE-EPINEPHRINE 0.25% -1:200000 IJ SOLN: INTRAMUSCULAR | Status: DC | PRN

## 2015-10-14 MED ORDER — BUPIVACAINE HCL (PF) 0.25 % IJ SOLN
INTRAMUSCULAR | Status: AC
  Filled 2015-10-14: qty 30

## 2015-10-14 MED ORDER — LEVALBUTEROL HCL 0.63 MG/3ML IN NEBU: 0.6300 mg | INHALATION_SOLUTION | RESPIRATORY_TRACT | Status: DC | PRN

## 2015-10-14 MED ORDER — LACTATED RINGERS IV SOLN
INTRAVENOUS | Status: DC
  Administered 2015-10-14 – 2015-10-16 (×3): via INTRAVENOUS

## 2015-10-14 MED ORDER — SENNOSIDES-DOCUSATE SODIUM 8.6-50 MG PO TABS
2.0000 | ORAL_TABLET | Freq: Every day | ORAL | Status: DC
  Administered 2015-10-14 – 2015-10-22 (×5): 2 via ORAL
  Filled 2015-10-14 (×6): qty 2

## 2015-10-14 MED ORDER — PROPOFOL 10 MG/ML IV BOLUS
INTRAVENOUS | Status: DC | PRN
  Administered 2015-10-14: 100 mg via INTRAVENOUS

## 2015-10-14 MED ORDER — TIOTROPIUM BROMIDE MONOHYDRATE 18 MCG IN CAPS
18.0000 ug | ORAL_CAPSULE | Freq: Every day | RESPIRATORY_TRACT | Status: DC
  Filled 2015-10-14: qty 5

## 2015-10-14 MED ORDER — PRAVASTATIN SODIUM 40 MG PO TABS: 40.0000 mg | ORAL_TABLET | Freq: Every day | ORAL | Status: DC

## 2015-10-14 MED ORDER — MORPHINE SULFATE (PF) 2 MG/ML IV SOLN: 1.0000 mg | INTRAVENOUS | Status: DC | PRN

## 2015-10-14 MED ORDER — TETANUS-DIPHTH-ACELL PERTUSSIS 5-2.5-18.5 LF-MCG/0.5 IM SUSP
0.5000 mL | Freq: Once | INTRAMUSCULAR | Status: AC
  Administered 2015-10-14: 0.5 mL via INTRAMUSCULAR
  Filled 2015-10-14: qty 0.5

## 2015-10-14 MED ORDER — MENTHOL 3 MG MT LOZG: 1.0000 | LOZENGE | OROMUCOSAL | Status: DC | PRN

## 2015-10-14 MED ORDER — ACETAMINOPHEN 160 MG/5ML PO SOLN: 325.0000 mg | ORAL | Status: DC | PRN

## 2015-10-14 MED ORDER — BUPIVACAINE HCL 0.25 % IJ SOLN
INTRAMUSCULAR | Status: DC | PRN
  Administered 2015-10-14: 10 mL

## 2015-10-14 MED ORDER — FERROUS SULFATE 325 (65 FE) MG PO TABS
325.0000 mg | ORAL_TABLET | Freq: Three times a day (TID) | ORAL | Status: DC
  Administered 2015-10-14 (×2): 325 mg via ORAL
  Filled 2015-10-14 (×5): qty 1

## 2015-10-14 MED ORDER — PANTOPRAZOLE SODIUM 40 MG PO TBEC: 40.0000 mg | DELAYED_RELEASE_TABLET | Freq: Every day | ORAL | Status: DC

## 2015-10-14 MED ORDER — ONDANSETRON HCL 4 MG/2ML IJ SOLN
INTRAMUSCULAR | Status: DC | PRN
  Administered 2015-10-14: 4 mg via INTRAVENOUS

## 2015-10-14 MED ORDER — FENTANYL CITRATE (PF) 100 MCG/2ML IJ SOLN: 50.0000 ug | INTRAMUSCULAR | Status: DC | PRN

## 2015-10-14 MED ORDER — PROPOFOL 10 MG/ML IV BOLUS
INTRAVENOUS | Status: AC
  Filled 2015-10-14: qty 20

## 2015-10-14 MED ORDER — GLYCOPYRROLATE 0.2 MG/ML IJ SOLN
INTRAMUSCULAR | Status: DC | PRN
  Administered 2015-10-14: 0.2 mg via INTRAVENOUS

## 2015-10-14 MED ORDER — PHENOL 1.4 % MT LIQD: 1.0000 | OROMUCOSAL | Status: DC | PRN

## 2015-10-14 MED ORDER — INSULIN ASPART 100 UNIT/ML ~~LOC~~ SOLN
0.0000 [IU] | Freq: Three times a day (TID) | SUBCUTANEOUS | Status: DC
  Administered 2015-10-14: 5 [IU] via SUBCUTANEOUS

## 2015-10-14 MED ORDER — INSULIN ASPART 100 UNIT/ML ~~LOC~~ SOLN: 0.0000 [IU] | Freq: Three times a day (TID) | SUBCUTANEOUS | Status: DC

## 2015-10-14 MED ORDER — OXYCODONE HCL 5 MG PO TABS
5.0000 mg | ORAL_TABLET | ORAL | Status: DC | PRN
  Administered 2015-10-14: 10 mg via ORAL
  Filled 2015-10-14: qty 2

## 2015-10-14 MED ORDER — METOCLOPRAMIDE HCL 5 MG/ML IJ SOLN: 5.0000 mg | Freq: Three times a day (TID) | INTRAMUSCULAR | Status: DC | PRN

## 2015-10-14 MED ORDER — SODIUM CHLORIDE 0.9 % IJ SOLN
INTRAMUSCULAR | Status: AC
  Filled 2015-10-14: qty 10

## 2015-10-14 MED ORDER — ONDANSETRON HCL 4 MG/2ML IJ SOLN: 4.0000 mg | Freq: Three times a day (TID) | INTRAMUSCULAR | Status: DC | PRN

## 2015-10-14 MED ORDER — ISOSORBIDE MONONITRATE ER 60 MG PO TB24: 60.0000 mg | ORAL_TABLET | Freq: Every day | ORAL | Status: DC

## 2015-10-14 MED ORDER — VANCOMYCIN HCL IN DEXTROSE 1-5 GM/200ML-% IV SOLN
1000.0000 mg | Freq: Once | INTRAVENOUS | Status: AC
  Administered 2015-10-14: 1000 mg via INTRAVENOUS
  Filled 2015-10-14: qty 200

## 2015-10-14 MED ORDER — OXYCODONE HCL 5 MG PO TABS: 5.0000 mg | ORAL_TABLET | Freq: Once | ORAL | Status: DC | PRN

## 2015-10-14 MED ORDER — SUFENTANIL CITRATE 50 MCG/ML IV SOLN
INTRAVENOUS | Status: DC | PRN
Start: 2015-10-14 — End: 2015-10-14
  Administered 2015-10-14 (×2): 5 ug via INTRAVENOUS

## 2015-10-14 MED ORDER — VANCOMYCIN HCL IN DEXTROSE 1-5 GM/200ML-% IV SOLN
1000.0000 mg | Freq: Two times a day (BID) | INTRAVENOUS | Status: AC
  Administered 2015-10-14: 1000 mg via INTRAVENOUS
  Filled 2015-10-14: qty 200

## 2015-10-14 MED ORDER — FENTANYL CITRATE (PF) 100 MCG/2ML IJ SOLN: 25.0000 ug | INTRAMUSCULAR | Status: DC | PRN

## 2015-10-14 MED ORDER — SODIUM CHLORIDE 0.9 % IJ SOLN: 3.0000 mL | Freq: Two times a day (BID) | INTRAMUSCULAR | Status: DC

## 2015-10-14 MED ORDER — LIDOCAINE HCL (CARDIAC) 20 MG/ML IV SOLN
INTRAVENOUS | Status: DC | PRN
  Administered 2015-10-14: 40 mg via INTRAVENOUS

## 2015-10-14 MED ORDER — SUCCINYLCHOLINE CHLORIDE 20 MG/ML IJ SOLN
INTRAMUSCULAR | Status: DC | PRN
  Administered 2015-10-14: 70 mg via INTRAVENOUS

## 2015-10-14 MED ORDER — METOCLOPRAMIDE HCL 5 MG PO TABS: 5.0000 mg | ORAL_TABLET | Freq: Three times a day (TID) | ORAL | Status: DC | PRN

## 2015-10-14 MED ORDER — ACETAMINOPHEN 325 MG PO TABS
650.0000 mg | ORAL_TABLET | Freq: Four times a day (QID) | ORAL | Status: DC | PRN
Start: 2015-10-14 — End: 2015-10-16
  Administered 2015-10-14 – 2015-10-16 (×4): 650 mg via ORAL
  Filled 2015-10-14 (×4): qty 2

## 2015-10-14 MED ORDER — ACETAMINOPHEN 650 MG RE SUPP: 650.0000 mg | Freq: Four times a day (QID) | RECTAL | Status: DC | PRN

## 2015-10-14 MED ORDER — SUFENTANIL CITRATE 50 MCG/ML IV SOLN
INTRAVENOUS | Status: AC
  Filled 2015-10-14: qty 1

## 2015-10-14 SURGICAL SUPPLY — 62 items
BAG DECANTER FOR FLEXI CONT (MISCELLANEOUS) ×3 IMPLANT
BANDAGE ACE 6X5 VEL STRL LF (GAUZE/BANDAGES/DRESSINGS) ×3 IMPLANT
BANDAGE ELASTIC 4 VELCRO ST LF (GAUZE/BANDAGES/DRESSINGS) ×3 IMPLANT
BNDG COHESIVE 4X5 TAN STRL (GAUZE/BANDAGES/DRESSINGS) ×3 IMPLANT
BNDG ELASTIC 6X10 VLCR STRL LF (GAUZE/BANDAGES/DRESSINGS) ×6 IMPLANT
BNDG GAUZE ELAST 4 BULKY (GAUZE/BANDAGES/DRESSINGS) ×3 IMPLANT
COVER SURGICAL LIGHT HANDLE (MISCELLANEOUS) ×3 IMPLANT
CUFF TOURNIQUET SINGLE 18IN (TOURNIQUET CUFF) ×3 IMPLANT
CUFF TOURNIQUET SINGLE 24IN (TOURNIQUET CUFF) ×3 IMPLANT
DRAPE U-SHAPE 76X120 STRL (DRAPES) ×3 IMPLANT
DRSG EMULSION OIL 3X3 NADH (GAUZE/BANDAGES/DRESSINGS) ×3 IMPLANT
DRSG PAD ABDOMINAL 8X10 ST (GAUZE/BANDAGES/DRESSINGS) ×6 IMPLANT
ELECT PENCIL ROCKER SW 15FT (MISCELLANEOUS) ×3 IMPLANT
ELECT REM PT RETURN 9FT ADLT (ELECTROSURGICAL) ×3
ELECTRODE REM PT RTRN 9FT ADLT (ELECTROSURGICAL) ×1 IMPLANT
GAUZE SPONGE 4X4 12PLY STRL (GAUZE/BANDAGES/DRESSINGS) ×3 IMPLANT
GAUZE XEROFORM 5X9 LF (GAUZE/BANDAGES/DRESSINGS) ×3 IMPLANT
GLOVE BIO SURGEON STRL SZ 6.5 (GLOVE) ×2 IMPLANT
GLOVE BIO SURGEONS STRL SZ 6.5 (GLOVE) ×1
GLOVE BIOGEL PI IND STRL 6.5 (GLOVE) ×1 IMPLANT
GLOVE BIOGEL PI IND STRL 8.5 (GLOVE) ×1 IMPLANT
GLOVE BIOGEL PI INDICATOR 6.5 (GLOVE) ×2
GLOVE BIOGEL PI INDICATOR 8.5 (GLOVE) ×2
GLOVE SS BIOGEL STRL SZ 8.5 (GLOVE) ×1 IMPLANT
GLOVE SUPERSENSE BIOGEL SZ 8.5 (GLOVE) ×2
GOWN STRL REUS W/ TWL LRG LVL3 (GOWN DISPOSABLE) ×1 IMPLANT
GOWN STRL REUS W/TWL 2XL LVL3 (GOWN DISPOSABLE) ×6 IMPLANT
GOWN STRL REUS W/TWL LRG LVL3 (GOWN DISPOSABLE) ×2
HANDPIECE INTERPULSE COAX TIP (DISPOSABLE)
KIT BASIN OR (CUSTOM PROCEDURE TRAY) ×3 IMPLANT
KIT ROOM TURNOVER OR (KITS) ×3 IMPLANT
MANIFOLD NEPTUNE II (INSTRUMENTS) ×3 IMPLANT
NEEDLE HYPO 25GX1X1/2 BEV (NEEDLE) ×3 IMPLANT
NS IRRIG 1000ML POUR BTL (IV SOLUTION) ×3 IMPLANT
PACK ORTHO EXTREMITY (CUSTOM PROCEDURE TRAY) ×3 IMPLANT
PAD ARMBOARD 7.5X6 YLW CONV (MISCELLANEOUS) ×6 IMPLANT
PAD CAST 4YDX4 CTTN HI CHSV (CAST SUPPLIES) ×1 IMPLANT
PADDING CAST COTTON 4X4 STRL (CAST SUPPLIES) ×2
PADDING CAST COTTON 6X4 STRL (CAST SUPPLIES) ×3 IMPLANT
SET HNDPC FAN SPRY TIP SCT (DISPOSABLE) IMPLANT
SPLINT FIBERGLASS 4X30 (CAST SUPPLIES) ×3 IMPLANT
SPLINT PLASTER CAST XFAST 5X30 (CAST SUPPLIES) ×1 IMPLANT
SPLINT PLASTER XFAST SET 5X30 (CAST SUPPLIES) ×2
SPONGE GAUZE 4X4 12PLY STER LF (GAUZE/BANDAGES/DRESSINGS) ×3 IMPLANT
SPONGE LAP 18X18 X RAY DECT (DISPOSABLE) ×3 IMPLANT
SPONGE LAP 4X18 X RAY DECT (DISPOSABLE) ×3 IMPLANT
STOCKINETTE IMPERVIOUS 9X36 MD (GAUZE/BANDAGES/DRESSINGS) ×3 IMPLANT
SURGIFLO W/THROMBIN 8M KIT (HEMOSTASIS) IMPLANT
SUT BONE WAX W31G (SUTURE) ×3 IMPLANT
SUT ETHILON 4 0 PS 2 18 (SUTURE) ×12 IMPLANT
SUT PDS 0 CT 1 18  CR/8 (SUTURE) ×6 IMPLANT
SUT PROLENE 1 CT (SUTURE) ×3 IMPLANT
SUT VIC AB 2-0 CT1 18 (SUTURE) ×3 IMPLANT
SYR CONTROL 10ML LL (SYRINGE) ×3 IMPLANT
TOWEL OR 17X24 6PK STRL BLUE (TOWEL DISPOSABLE) ×3 IMPLANT
TOWEL OR 17X26 10 PK STRL BLUE (TOWEL DISPOSABLE) ×3 IMPLANT
TUBE ANAEROBIC SPECIMEN COL (MISCELLANEOUS) IMPLANT
TUBE CONNECTING 12'X1/4 (SUCTIONS) ×1
TUBE CONNECTING 12X1/4 (SUCTIONS) ×2 IMPLANT
UNDERPAD 30X30 INCONTINENT (UNDERPADS AND DIAPERS) ×3 IMPLANT
WATER STERILE IRR 1000ML POUR (IV SOLUTION) ×3 IMPLANT
YANKAUER SUCT BULB TIP NO VENT (SUCTIONS) ×3 IMPLANT

## 2015-10-14 NOTE — ED Notes (Signed)
Pt placed on fracture bedpan with ease however pt was unable to urinate. Will try again shortly.

## 2015-10-14 NOTE — Progress Notes (Signed)
[facility]  PMH of af-fib on Xarelto, HTN, COPD, DM, s/p bilateral knee replacement by [name] 5-7 years ago, GERD, depression, congestive heart failure, who presents with left knee pain after fall.  Open periprosthetic femur fracture, left - s/p I and D and splinting - management per ortho  Leukocytosis:  Likely due to stress-induced demargination. Patient does not have signs of infection. -Follow-up CBC  Atrial Fibrillation: CHA2DS2-VASc Score is 6, needs oral anticoagulation. Patient is on Xarelto at home. INR is 1.11 on admission.   - resume Xarelto per ortho  DM-II:  - Last A1c not on record. Patient is taking metformin and Januvia at home -SSI -Check A1c  COPD: stable. -Continue Xopenex inhaler, Spiriva spiriva inhaler  CHF (congestive heart failure) (HCC): No 2-D echo on record, not sure which type of CHF. Patient is on Lasix. No leg edema. CHF is compensated.  -Hold her Lasix for now as she is compensated  HLD: - Last LDL was not on record- current LDL is 121 -Continue home medications: Pravastatin  GERD: -Protonix  Depression:  -Stable, no suicidal or homicidal ideations. -Continue home medications: Celexa    Calvert Cantor, MD

## 2015-10-14 NOTE — ED Provider Notes (Signed)
By signing my name below, I, [name] [name], attest that this documentation has been prepared under the direction and in the presence of [name], DO. Electronically Signed: [name], ED Scribe. 10/14/2015. 3:26 AM.  TIME SEEN: 12:40 AM  CHIEF COMPLAINT: Fall, open left knee deformity  Level V caveat secondary to trauma  HPI: Brought in by EMS, [name] [name] is a 82 y.o. female with history of af-fib on Xarelto, HTN, COPD, DM, s/p bilateral knee replacement by [name] 5-7 years ago who presents to the Emergency Department complaining of a witnessed fall tonight at home. Per EMS, the pt tripped and fell forwards. The patient's daughter states that she was looking out of the window when she turned around and "just fell".  Daughter does not believe that the pt struck her head.  Associated symptoms include obvious left knee deformity. 200 mcg of fentanyl given by EMS PTA. Tetanus status unknown. Pt has an allergy to penicillin, daughter states that she is not sure of the nature of the reaction.   HR is 50s.  Daughter reports this is patient's baseline. Patient denies chest pain or shortness of breath.   PAST MEDICAL HISTORY/PAST SURGICAL HISTORY:  Past Medical History  Diagnosis Date  . Hypertension   . Diabetes mellitus without complication (HCC)   . COPD (chronic obstructive pulmonary disease) (HCC)   . Atrial fibrillation (HCC)   . Glaucoma     Bilaterally  . CHF (congestive heart failure) (HCC)   . HLD (hyperlipidemia)   . GERD (gastroesophageal reflux disease)   . Depression     MEDICATIONS:  Prior to Admission medications   Not on File    ALLERGIES:  Allergies  Allergen Reactions  . Penicillins Other (See Comments)    unknown    SOCIAL HISTORY:  Social History  Substance Use Topics  . Smoking status: Never Smoker   . Smokeless tobacco: Not on file  . Alcohol Use: No    FAMILY HISTORY: No family history on file.  EXAM: BP 166/79 mmHg  Pulse 51   Temp(Src) 97.3 F (36.3 C) (Oral)  Resp 18  Ht  (1.6 m)  Wt 148 lb (67.132 kg)  BMI 26.22 kg/m2  SpO2 100% CONSTITUTIONAL: Alert and oriented x 3and responds appropriately to questions. GCS 15, Elderly and in no distress, denies any pain HEAD: Normocephalic; atraumatic EYES: Conjunctivae clear, PERRL, EOMI ENT: normal nose; no rhinorrhea; moist mucous membranes; pharynx without lesions noted; dried blood around lips, no tongue injury or no dental injury;  no septal hematoma,  NECK: Supple, no meningismus, no LAD; no midline spinal tenderness, step-off or deformity CARD: RRR; S1 and S2 appreciated; no murmurs, no clicks, no rubs, no gallops RESP: Normal chest excursion without splinting or tachypnea; breath sounds clear and equal bilaterally; no wheezes, no rhonchi, no rales; chest wall stable, nontender to palpation ABD/GI: Normal bowel sounds; non-distended; soft, non-tender, no rebound, no guarding PELVIS:  stable, nontender to palpation BACK:  The back appears normal and is non-tender to palpation, there is no CVA tenderness; no midline spinal tenderness, step-off or deformity EXT:  No edema; normal capillary refill; no cyanosis, obvious deformity to distal left femur, 1 cm superficial laceration to anterolateral distal left femur with associated hematoma and active bleeding, 2+ DP/PT pulses bilaterally, otherwise extremities have no tenderness or deformity SKIN: Normal color for age and race; warm NEURO: Moves all extremities equally; reports normal sensation diffusely; CN 2-12 intact PSYCH: The patient's mood and manner  are appropriate. Grooming and personal hygiene are appropriate.  MEDICAL DECISION MAKING: Patient here after possible mechanical fall with obvious distal femur injury. Neurovascular intact distally. Does have a hematoma with active bleeding that is controlled with quick clot and a pressure dressing. X-rays confirmed fracture that is comminuted. Placed in knee  immobilizer. Will update tetanus vaccination. Laceration appears small and superficial this could be an open fracture. I do not see obvious air on the portable x-ray. Will consult orthopedics on call. We'll also obtain CT imaging of her head and cervical spine given she is not sure if she hit her head and has a distracting injury and is on Xarelto. No other sign of trauma on exam. Chest and pelvis x-rays unremarkable. We'll continue IV fentanyl as needed. We'll keep her NPO.  No other sign of trauma on exam.  ED PROGRESS:   1:08 AM-Consult complete with Dr. Shon Baton, on call for [name] (Ortho). Patient case explained and discussed. He recommends CT of the left femur and he will have [name] see the pt in the morning. Recommends medical admission once workup is complete. He would like to be paged after CT L knee complete.     3:00 AM  Pt's CT head and cervical spine show no acute injury. C-spine has been cleared clinically and c-collar removed. CT of the left knee shows acute comminuted fractures of the distal left femoral metaphysis extending to the base of the femoral component of the knee arthroplasty. There is soft tissue gas present with a displaced bone fragment demonstrate just beneath the skin surface consistent with an open fracture. She is allergic to penicillin, reaction unknown. We'll discuss with orthopedics for antibiotic recommendations.  3:04 AM Updated Dr. Shon Baton on the results of the patient's CT scan. He recommends IV vancomycin and he will see the pt in the ED but still would like Medicine admission.   3:22 AM-Consult complete with Dr. Clyde Lundborg (Hospitalist). Patient case explained and discussed. Agrees to admit patient for further evaluation and treatment, Inpatient telemetry. He requested I placed holding orders. Care transitioned to Hospitalist and Orthopedics. Call ended at 3:24 AM        EKG Interpretation  Date/Time:  Tuesday October 14 2015 00:59:19 EST Ventricular Rate:   53 PR Interval:  172 QRS Duration: 112 QT Interval:  481 QTC Calculation: 452 R Axis:   38 Text Interpretation:  Sinus rhythm Borderline intraventricular conduction delay No old tracing to compare Confirmed by Rockie Vawter,  DO, Agnes Brightbill ([id number]) on 10/14/2015 1:18:11 AM        I personally performed the services described in this documentation, which was scribed in my presence. The recorded information has been reviewed and is accurate.     Layla Maw Tyric Rodeheaver, DO 10/14/15 [phone]

## 2015-10-14 NOTE — Brief Op Note (Signed)
10/14/2015  5:07 AM  PATIENT:  [name] [name]  82 y.o. female  PRE-OPERATIVE DIAGNOSIS:  Left knee laceration  POST-OPERATIVE DIAGNOSIS:  Left knee laceration  PROCEDURE:  Procedure(s): IRRIGATION AND DEBRIDEMENT LEFT LEG (Left)  SURGEON:  Surgeon(s) and Role:    * Venita Lick, MD - Primary  PHYSICIAN ASSISTANT:   ASSISTANTS: none   ANESTHESIA:   general  EBL:     BLOOD ADMINISTERED:none  DRAINS: none   LOCAL MEDICATIONS USED:  MARCAINE     SPECIMEN:  No Specimen  DISPOSITION OF SPECIMEN:  N/A  COUNTS:  YES  TOURNIQUET:  * No tourniquets in log *  DICTATION: .Other Dictation: Dictation Number [id number]  PLAN OF CARE: Admit to inpatient   PATIENT DISPOSITION:  PACU - hemodynamically stable.

## 2015-10-14 NOTE — Progress Notes (Signed)
Orthopedic Tech Progress Note Patient Details:  [name] [name] 10-May-1922 [id number]  Patient ID: [name] [name], female   DOB: [birth year], 82 y.o.   MRN: [id number] ohf applied  [name] 10/14/2015, 6:18 AM

## 2015-10-14 NOTE — Progress Notes (Signed)
Inpatient Diabetes Program Recommendations  AACE/ADA: New Consensus Statement on Inpatient Glycemic Control (2015)  Target Ranges:  Prepandial:   less than 140 mg/dL      Peak postprandial:   less than 180 mg/dL (1-2 hours)      Critically ill patients:  140 - 180 mg/dL   Review of Glycemic Control  Inpatient Diabetes Program Recommendations:  Correction (SSI): add Novolog sens TID Thank you  Piedad Climes BSN, RN,CDE Inpatient Diabetes Coordinator [phone] (team pager)

## 2015-10-14 NOTE — Transfer of Care (Signed)
Immediate Anesthesia Transfer of Care Note  Patient: [name] [name]  Procedure(s) Performed: Procedure(s): IRRIGATION AND DEBRIDEMENT LEFT LEG (Left)  Patient Location: PACU  Anesthesia Type:General  Level of Consciousness: sedated, patient cooperative and responds to stimulation  Airway & Oxygen Therapy: Patient Spontanous Breathing and Patient connected to nasal cannula oxygen  Post-op Assessment: Report given to RN, Post -op Vital signs reviewed and stable and Patient moving all extremities X 4  Post vital signs: Reviewed and stable  Last Vitals:  Filed Vitals:   10/14/15 0338 10/14/15 0511  BP:  116/75  Pulse: 51 87  Temp:    Resp: 17 16    Complications: No apparent anesthesia complications

## 2015-10-14 NOTE — Anesthesia Preprocedure Evaluation (Signed)
Anesthesia Evaluation  Patient identified by MRN, date of birth, ID band Patient awake    Reviewed: Allergy & Precautions, NPO status , Patient's Chart, lab work & pertinent test results  History of Anesthesia Complications Negative for: history of anesthetic complications  Airway Mallampati: IV  TM Distance: >3 FB Neck ROM: Full  Mouth opening: Limited Mouth Opening  Dental  (+) Teeth Intact   Pulmonary neg shortness of breath, neg sleep apnea, COPD,  COPD inhaler, neg recent URI, neg PE   breath sounds clear to auscultation       Cardiovascular hypertension, Pt. on medications + Past MI and +CHF  + dysrhythmias Atrial Fibrillation  Rhythm:Irregular     Neuro/Psych PSYCHIATRIC DISORDERS Depression negative neurological ROS     GI/Hepatic Neg liver ROS, GERD  Medicated and Controlled,  Endo/Other  diabetes, Type 2  Renal/GU      Musculoskeletal Left open periprosthetic femur fracture   Abdominal   Peds  Hematology negative hematology ROS (+)   Anesthesia Other Findings   Reproductive/Obstetrics                             Anesthesia Physical Anesthesia Plan  ASA: III  Anesthesia Plan: General   Post-op Pain Management:    Induction: Intravenous  Airway Management Planned: Oral ETT  Additional Equipment: None  Intra-op Plan:   Post-operative Plan: Extubation in OR  Informed Consent: I have reviewed the patients History and Physical, chart, labs and discussed the procedure including the risks, benefits and alternatives for the proposed anesthesia with the patient or authorized representative who has indicated his/her understanding and acceptance.   Dental advisory given  Plan Discussed with: CRNA and Surgeon  Anesthesia Plan Comments:         Anesthesia Quick Evaluation

## 2015-10-14 NOTE — Progress Notes (Signed)
Orthopedic Tech Progress Note Patient Details:  [name] [name] [birth year] [id number]  Ortho Devices Type of Ortho Device: Knee Immobilizer Ortho Device/Splint Location: lle Ortho Device/Splint Interventions: Ordered, Application   [name] 10/14/2015, 1:18 AM

## 2015-10-14 NOTE — Anesthesia Procedure Notes (Signed)
Procedure Name: Intubation Date/Time: 10/14/2015 4:24 AM Performed by: Melina Schools Pre-anesthesia Checklist: Patient identified, Emergency Drugs available, Suction available, Patient being monitored and Timeout performed Patient Re-evaluated:Patient Re-evaluated prior to inductionOxygen Delivery Method: Circle system utilized Preoxygenation: Pre-oxygenation with 100% oxygen Laryngoscope Size: Mac and 3 Grade View: Grade II Tube type: Oral Tube size: 7.5 mm Number of attempts: 1 Airway Equipment and Method: Stylet Placement Confirmation: ETT inserted through vocal cords under direct vision,  positive ETCO2 and breath sounds checked- equal and bilateral Secured at: 23 cm Tube secured with: Tape Dental Injury: Teeth and Oropharynx as per pre-operative assessment

## 2015-10-14 NOTE — H&P (Signed)
[facility] History and Physical  [name] [name] ZOX:[id number] DOB: 10/24/1921 DOA: 10/14/2015  Referring physician: ED physician PCP: [name], MD  Specialists:   Chief Complaint: left knee pain after fall  HPI: [name] [name] is a 82 y.o. female with PMH of   af-fib on Xarelto, HTN, COPD, DM, s/p bilateral knee replacement by [name] 5-7 years ago, GERD, depression, congestive heart failure, who presents with left knee pain after fall.  The patient's daughter, patient had a fall when she was watching outside from window, turned around and tripped her steps. She injured her left leg and left knee. She has left knee deformity. Her daughter does not believe that the pt struck her head. She dose not have tingling sensations in her legs. She denies chest pain, dizziness, shortness of breath, abdominal pain, diarrhea, symptoms or UTI. No vision change or hearing loss.  In ED, patient was found to have WC 10.9, lactate 1.11, temperature normal, bradycardia, electrolytes and renal function okay, negative CT head and CT C-spine for acute abnormalities. Chest x-ray did not show rib fracture. X-ray of left femur and CT-left femur showed acute comminuted fractures of the distal left femoral metaphysis extending to the base of the femoral component of the knee arthroplasty. Diffuse soft tissue gas is present with a displaced bone fragment demonstrated just beneath the skin surface consistent with an open fracture. Patient is admitted to inpatient for further eval and treatment. Orthopedic surgeon was consulted by EDP.  EKG: Independently reviewed.  QTC 452, bradycardia  Where does patient live?   At home    Can patient participate in ADLs?   Little  Review of Systems:   General: no fevers, chills, no changes in body weight, has fatigue HEENT: no blurry vision, hearing changes or sore throat Pulm: no dyspnea, coughing, wheezing CV: no chest pain, palpitations Abd: no nausea, vomiting,  abdominal pain, diarrhea, constipation GU: no dysuria, burning on urination, increased urinary frequency, hematuria  Ext: no leg edema Neuro: left knee pain and deformity. Skin: no rash MSK: No muscle spasm, no deformity, no limitation of range of movement in spin Heme: No easy bruising.  Travel history: No recent long distant travel.  Allergy:  Allergies  Allergen Reactions  . Penicillins Other (See Comments)    unknown    Past Medical History  Diagnosis Date  . Hypertension   . Diabetes mellitus without complication (HCC)   . COPD (chronic obstructive pulmonary disease) (HCC)   . Atrial fibrillation (HCC)   . Glaucoma     Bilaterally  . CHF (congestive heart failure) (HCC)   . HLD (hyperlipidemia)   . GERD (gastroesophageal reflux disease)   . Depression     Past Surgical History  Procedure Laterality Date  . Cardiac catheterization  2003  . Replacement total knee bilateral Bilateral     Social History:  reports that she has never smoked. She does not have any smokeless tobacco history on file. She reports that she does not drink alcohol. Her drug history is not on file.  Family History: No family history on file.   Prior to Admission medications   Medication Sig Start Date End Date Taking? Authorizing Provider  citalopram (CELEXA) 10 MG tablet Take 10 mg by mouth daily.   Yes Historical Provider, MD    Physical Exam: Filed Vitals:   10/14/15 0240 10/14/15 0300 10/14/15 0330 10/14/15 0338  BP: 166/79 146/78    Pulse: 51 52 53 51  Temp:  TempSrc:      Resp: Height:      Weight:      SpO2: 100% 97% 96% 97%   General: Not in acute distress HEENT:       Eyes: PERRL, EOMI, no scleral icterus.       ENT: No discharge from the ears and nose, no pharynx injection, no tonsillar enlargement.        Neck: No JVD, no bruit, no mass felt. Heme: No neck lymph node enlargement. Cardiac: S1/S2, RRR, No murmurs, No gallops or rubs. Pulm:  No rales,  wheezing, rhonchi or rubs. Abd: Soft, nondistended, nontender, no rebound pain, no organomegaly, BS present. Ext: No pitting leg edema bilaterally. Musculoskeletal: pt has left knee Immobilizer placed. I did not open it. Per EDP's note, "there is obvious deformity to distal left femur, 1 cm superficial laceration to anterolateral distal left femur with associated hematoma and active bleeding, 2+ DP/PT pulses bilaterally, otherwise extremities have no tenderness or deformity" Skin: No rashes.  Neuro: Alert, oriented X3, cranial nerves II-XII grossly intact, moves all extremities. Psych: Patient is not psychotic, no suicidal or hemocidal ideation.  Labs on Admission:  Basic Metabolic Panel:  Recent Labs Lab 10/14/15 0052 10/14/15 0058  NA 135 136  K 4.4 4.3  CL 101 101  CO2 22  --   GLUCOSE 252* 249*  BUN 12 15  CREATININE 0.62 0.60  CALCIUM 8.9  --    Liver Function Tests:  Recent Labs Lab 10/14/15 0052  AST 17  ALT 11*  ALKPHOS 66  BILITOT 0.4  PROT 6.5  ALBUMIN 3.5   No results for input(s): LIPASE, AMYLASE in the last 168 hours. No results for input(s): AMMONIA in the last 168 hours. CBC:  Recent Labs Lab 10/14/15 0052 10/14/15 0058  WBC 10.9*  --   HGB 12.3 14.3  HCT 40.1 42.0  MCV 76.8*  --   PLT 354  --    Cardiac Enzymes: No results for input(s): CKTOTAL, CKMB, CKMBINDEX, TROPONINI in the last 168 hours.  BNP (last 3 results) No results for input(s): BNP in the last 8760 hours.  ProBNP (last 3 results) No results for input(s): PROBNP in the last 8760 hours.  CBG: No results for input(s): GLUCAP in the last 168 hours.  Radiological Exams on Admission: Ct Head Wo Contrast  10/14/2015  CLINICAL DATA:  Witnessed fall at home. Did not hit head. Left lower femoral fracture. EXAM: CT HEAD WITHOUT CONTRAST CT CERVICAL SPINE WITHOUT CONTRAST TECHNIQUE: Multidetector CT imaging of the head and cervical spine was performed following the standard protocol  without intravenous contrast. Multiplanar CT image reconstructions of the cervical spine were also generated. COMPARISON:  None. FINDINGS: CT HEAD FINDINGS Diffuse cerebral atrophy. Ventricular dilatation consistent with central atrophy. Low-attenuation changes in the deep white matter consistent with small vessel ischemia. No mass effect or midline shift. No abnormal extra-axial fluid collections. Gray-white matter junctions are distinct. Basal cisterns are not effaced. No evidence of acute intracranial hemorrhage. No depressed skull fractures. Partial opacification of some of the mastoid air cells bilaterally. Visualized paranasal sinuses are not opacified. Vascular calcifications. CT CERVICAL SPINE FINDINGS Diffuse bone demineralization. Normal alignment of the cervical spine. Degenerative changes throughout with narrowed cervical interspaces and associated endplate hypertrophic changes. Degenerative changes in the facet joints. No vertebral compression deformities. No prevertebral soft tissue swelling. Benign-appearing cystic lesion in the right lateral mass of C5. C1-2 articulation appears intact. Interstitial infiltrates in  the lung apices may indicate edema. Soft tissues are unremarkable. Vascular calcifications. IMPRESSION: No acute intracranial abnormalities. Chronic atrophy and small vessel ischemic changes. Normal alignment of the cervical spine. Diffuse degenerative changes. No acute bony abnormalities. Electronically Signed   By: Burman Nieves M.D.   On: 10/14/2015 02:06   Ct Cervical Spine Wo Contrast  10/14/2015  CLINICAL DATA:  Witnessed fall at home. Did not hit head. Left lower femoral fracture. EXAM: CT HEAD WITHOUT CONTRAST CT CERVICAL SPINE WITHOUT CONTRAST TECHNIQUE: Multidetector CT imaging of the head and cervical spine was performed following the standard protocol without intravenous contrast. Multiplanar CT image reconstructions of the cervical spine were also generated. COMPARISON:   None. FINDINGS: CT HEAD FINDINGS Diffuse cerebral atrophy. Ventricular dilatation consistent with central atrophy. Low-attenuation changes in the deep white matter consistent with small vessel ischemia. No mass effect or midline shift. No abnormal extra-axial fluid collections. Gray-white matter junctions are distinct. Basal cisterns are not effaced. No evidence of acute intracranial hemorrhage. No depressed skull fractures. Partial opacification of some of the mastoid air cells bilaterally. Visualized paranasal sinuses are not opacified. Vascular calcifications. CT CERVICAL SPINE FINDINGS Diffuse bone demineralization. Normal alignment of the cervical spine. Degenerative changes throughout with narrowed cervical interspaces and associated endplate hypertrophic changes. Degenerative changes in the facet joints. No vertebral compression deformities. No prevertebral soft tissue swelling. Benign-appearing cystic lesion in the right lateral mass of C5. C1-2 articulation appears intact. Interstitial infiltrates in the lung apices may indicate edema. Soft tissues are unremarkable. Vascular calcifications. IMPRESSION: No acute intracranial abnormalities. Chronic atrophy and small vessel ischemic changes. Normal alignment of the cervical spine. Diffuse degenerative changes. No acute bony abnormalities. Electronically Signed   By: Burman Nieves M.D.   On: 10/14/2015 02:06   Dg Pelvis Portable  10/14/2015  CLINICAL DATA:  Status post fall, with concern for pelvic injury. Initial encounter. EXAM: PORTABLE PELVIS 1-2 VIEWS COMPARISON:  None. FINDINGS: There is no evidence of fracture or dislocation. Both femoral heads are seated normally within their respective acetabula. The patient is status post vertebroplasty at the lower lumbar spine. Mild surrounding degenerative change is noted. The sacroiliac joints are unremarkable in appearance. The visualized bowel gas pattern is grossly unremarkable in appearance. Scattered  vascular calcifications are seen. IMPRESSION: No evidence of fracture or dislocation. Electronically Signed   By: Roanna Raider M.D.   On: 10/14/2015 01:18   Ct Femur Left Wo Contrast  10/14/2015  CLINICAL DATA:  Witnessed fall at home. Left lower femoral fracture on plain radiographs. EXAM: CT OF THE LEFT FEMUR WITHOUT CONTRAST TECHNIQUE: Multidetector CT imaging was performed according to the standard protocol. Multiplanar CT image reconstructions were also generated. COMPARISON:  Left knee 10/14/2015 FINDINGS: Left total knee arthroplasty. Acute comminuted fractures of the distal left femoral metaphysis. Fracture lines extend to the superior portion of the femoral prosthesis. There is anterior displacement and angulation of the distal fracture fragments with multiple displaced and angulated butterfly fragments. We bone femoral head fragment is rotated anteriorly. Multiple displaced cortical fragments are present with overriding and impaction of fracture fragments. Scattered gas collections are demonstrated in about the fracture fragments and infiltrating into the subcutaneous fat into the adjacent muscle compartments anteriorly and posteriorly. Gas within the fracture is consistent with open fracture. A displaced bone fragment is demonstrated along the superolateral aspect of the left lower leg at the level of the knee with extension to the skin surface consistent with a penetrating fragment. Small left knee effusion. Soft  tissue infiltration in the subcutaneous fat and muscles around the left kidney consistent with edema or hematomas. Prominent vascular calcifications. No dislocation of the left hip. Visualization of the left knee is limited due to streak artifact arising from the femoral prosthesis. IMPRESSION: Acute comminuted fractures of the distal left femoral metaphysis extending to the base of the femoral component of the knee arthroplasty. Diffuse soft tissue gas is present with a displaced bone  fragment demonstrated just beneath the skin surface consistent with an open fracture. Electronically Signed   By: Burman Nieves M.D.   On: 10/14/2015 02:12   Dg Chest Portable 1 View  10/14/2015  CLINICAL DATA:  Status post fall, with concern for chest injury. Initial encounter. EXAM: PORTABLE CHEST 1 VIEW COMPARISON:  None. FINDINGS: The lungs are well-aerated. Mild peribronchial thickening is noted. There is no evidence of focal opacification, pleural effusion or pneumothorax. The cardiomediastinal silhouette is mildly enlarged. No acute osseous abnormalities are seen. IMPRESSION: Mild peribronchial thickening noted. Mild cardiomegaly. Lungs otherwise grossly clear. No displaced rib fracture seen. Electronically Signed   By: Roanna Raider M.D.   On: 10/14/2015 01:17   Dg Knee Left Port  10/14/2015  CLINICAL DATA:  Trauma.  Fall.  Open left knee injury. EXAM: PORTABLE LEFT KNEE - 1-2 VIEW COMPARISON:  None. FINDINGS: Left total knee arthroplasty with patellar femoral component. There is an acute comminuted and displaced fracture of the distal left femoral metaphysis with fracture lines extending to the base of the femoral component of the arthroplasty. There is anterior angulation of the major distal fracture fragments with anterior and medial displacement of fracture fragments. Medial overriding of fracture fragments. Several displaced butterfly fragments are present. Calcifications in the suprapatellar space appear to be well corticated and may be old loose bodies. Small effusion. Proximal tibia and fibula appear intact. IMPRESSION: Acute comminuted and displaced fractures of the distal left femoral metaphysis extending to the base of the to femoral component of the left knee arthroplasty. Electronically Signed   By: Burman Nieves M.D.   On: 10/14/2015 01:19   Dg Femur Port Min 2 Views Left  10/14/2015  CLINICAL DATA:  Trauma.  [employer].  Left open knee injury. EXAM: LEFT FEMUR PORTABLE 2  VIEWS COMPARISON:  None. FINDINGS: Acute comminuted and displaced fractures of the distal left radial metaphysis extending to the base of the femoral component of a left knee arthroplasty. See additional report of left knee same date. Limited view of the proximal left femur appears intact. No gross evidence of dislocation of the left hip. Vascular calcifications. IMPRESSION: Acute comminuted and displaced fractures of the distal left radial metaphysis. See additional report of left knee. Proximal left femur is grossly intact. Electronically Signed   By: Burman Nieves M.D.   On: 10/14/2015 01:23    Assessment/Plan Principal Problem:   Open femur fracture, left (HCC) Active Problems:   Diabetes mellitus without complication (HCC)   COPD (chronic obstructive pulmonary disease) (HCC)   Atrial fibrillation (HCC)   CHF (congestive heart failure) (HCC)   HLD (hyperlipidemia)   GERD (gastroesophageal reflux disease)   Femur fracture, left (HCC)   Fall  Open femur fracture, left (HCC): As evidenced by x-ray and CT scan. Patient has moderate pain now. No neurovascular compromise. Orthopedic surgeon was consulted.   - will admit to tele bed - Pain control: morphine prn and percocet - follow up ortho recs - NPO after MN - Gentle IVF: NS 50 cc/h - type and cross -  INR/PTT -IV vancomycin per orhto (just give one dose, need to confirm with ortho whether need to continue daily)  Leukocytosis: Likely due to stress-induced demargination. Patient does not have signs of infection. -Follow-up CBC  Atrial Fibrillation: CHA2DS2-VASc Score is 6, needs oral anticoagulation. Patient is on Xarelto at home. INR is 1.11 on admission. Heart rate slow. -will hold Xarelto now for possible surgery -tele monitoring  DM-II: Last A1c not on record. Patient is taking metformin and Januvia at home -SSI -Check A1c  COPD: stable. -Continue Xopenex inhaler, Spiriva spiriva inhaler  CHF (congestive heart failure)  (HCC): No 2-D echo on record, not sure which type of CHF. Patient is on Lasix. No leg edema. CHF is compensated.  -Hold her Lasix when patient is on NPo -Check BNP  HLD: Last LDL was not on record -Continue home medications: Pravastatin -Check FLP  GERD: -Protonix  Depression: Stable, no suicidal or homicidal ideations. -Continue home medications: Celexa   DVT ppx: SCD  Code Status: DNR Family Communication:  Yes, patient's daughter at bed side  Disposition Plan: Admit to inpatient   Date of Service 10/14/2015    Lorretta Harp [facility] Pager [phone]  If 7PM-7AM, please contact night-coverage [email or web address] Password [facility] 10/14/2015, 3:43 AM

## 2015-10-14 NOTE — Progress Notes (Signed)
In and out time 1340

## 2015-10-14 NOTE — ED Notes (Signed)
Pt taken to CT now

## 2015-10-14 NOTE — Op Note (Signed)
NAMEALEXANDR, OEHLER NO.:  [id number]  MEDICAL RECORD NO.:  [id number]  LOCATION:  [facility]                         FACILITY:  [facility]  PHYSICIAN:  Jannet Calip D. Shon Baton, M.D. DATE OF BIRTH:  [birth year]  DATE OF PROCEDURE:  10/14/2015 DATE OF DISCHARGE:                              OPERATIVE REPORT   PREOPERATIVE DIAGNOSIS:  Open left distal femur periprosthetic fracture.  POSTOPERATIVE DIAGNOSIS:  Open left distal femur periprosthetic fracture.  OPERATIVE PROCEDURES:  I and D, and application of long-leg splint.  COMPLICATIONS:  None.  CONDITION:  Stable.  HISTORY:  This is a 82 year old female about 5 to 7 years ago had a total knee replacement done by my partner, [name].  She presents today after returning in her room and falling from a standing height.  The patient was brought in as a level 2 trauma due to the fracture, on Xarelto and there is a question of head injury.  Preoperative workup was unremarkable and she is cleared medically for surgical intervention.  Because this was an open injury, I elected to move forward with just an I and D and stabilization with a splint.  All appropriate risks, benefits, and alternatives were discussed with the patient and her daughter and consent was obtained.  OPERATIVE NOTE:  The patient was brought to the operating room and placed supine on the operating table.  After successful induction of general anesthesia and endotracheal intubation, the left lower extremity was prepped and draped in a standard fashion.  Time-out was done to confirm patient, procedure, and all other pertinent important data. Once this was completed, I extended the about 1 cm laceration wound over the anterolateral aspect of the knee and debrided and removed any questionable material.  I then irrigated with a total of 3 L of saline.  At this point, I cleaned the skin edges and then closed the skin with a running vertical mattress suture of #1  Prolene.  An Adaptic and a bulky dry dressing were applied.  I then applied a long-leg splint with side struts over the need for further support.  The patient was ultimately extubated, transferred to the PACU without incident.  At the end of the case, all needle and sponge counts were correct.  There were no adverse intraoperative events.    Ivis Henneman D. Shon Baton, M.D.    DDB/MEDQ  D:  10/14/2015  T:  10/14/2015  Job:  [id number]

## 2015-10-14 NOTE — Consult Note (Signed)
[name] Rossetti, MD Chief Complaint: Left knee fracture - level 2 trauma activation History: HPI: Brought in by EMS, [name] [name] is a 82 y.o. female with history of af-fib on Xarelto, HTN, COPD, DM, s/p bilateral knee replacement by [name] 5-7 years ago who presents to the Emergency Department complaining of a witnessed fall tonight at home. Per EMS, the pt tripped and fell forwards. The patient's daughter states that she was looking out of the window when she turned around and "just fell". Daughter does not believe that the pt struck her head. Associated symptoms include obvious left knee deformity. 200 mcg of fentanyl given by EMS PTA. Tetanus status unknown. Pt has an allergy to penicillin, daughter states that she is not sure of the nature of the reaction.   HR is 50s. Daughter reports this is patient's baseline. Patient denies chest pain or shortness of breath.  Past Medical History  Diagnosis Date  . Hypertension   . Diabetes mellitus without complication (HCC)   . COPD (chronic obstructive pulmonary disease) (HCC)   . Atrial fibrillation (HCC)   . Glaucoma     Bilaterally  . CHF (congestive heart failure) (HCC)   . HLD (hyperlipidemia)   . GERD (gastroesophageal reflux disease)   . Depression     Allergies  Allergen Reactions  . Penicillins Other (See Comments)    unknown    No current facility-administered medications on file prior to encounter.   No current outpatient prescriptions on file prior to encounter.    Physical Exam: Filed Vitals:   10/14/15 0330 10/14/15 0338  BP:    Pulse: 53 51  Temp:    Resp: 12 17  alert and orientated No SOB/CP Abd soft/NT No pelvic pain - stable to AC/LC 1+ DP/PT pulses Compartments soft/NT Anterior-lateral 1 cm laceration with active bleeding noted left knee Obvious deformity of the knee No deformity/crepitus/or pain with ROM of UE or right LE  Image: Ct Head Wo Contrast  10/14/2015  CLINICAL DATA:  Witnessed fall at  home. Did not hit head. Left lower femoral fracture. EXAM: CT HEAD WITHOUT CONTRAST CT CERVICAL SPINE WITHOUT CONTRAST TECHNIQUE: Multidetector CT imaging of the head and cervical spine was performed following the standard protocol without intravenous contrast. Multiplanar CT image reconstructions of the cervical spine were also generated. COMPARISON:  None. FINDINGS: CT HEAD FINDINGS Diffuse cerebral atrophy. Ventricular dilatation consistent with central atrophy. Low-attenuation changes in the deep white matter consistent with small vessel ischemia. No mass effect or midline shift. No abnormal extra-axial fluid collections. Gray-white matter junctions are distinct. Basal cisterns are not effaced. No evidence of acute intracranial hemorrhage. No depressed skull fractures. Partial opacification of some of the mastoid air cells bilaterally. Visualized paranasal sinuses are not opacified. Vascular calcifications. CT CERVICAL SPINE FINDINGS Diffuse bone demineralization. Normal alignment of the cervical spine. Degenerative changes throughout with narrowed cervical interspaces and associated endplate hypertrophic changes. Degenerative changes in the facet joints. No vertebral compression deformities. No prevertebral soft tissue swelling. Benign-appearing cystic lesion in the right lateral mass of C5. C1-2 articulation appears intact. Interstitial infiltrates in the lung apices may indicate edema. Soft tissues are unremarkable. Vascular calcifications. IMPRESSION: No acute intracranial abnormalities. Chronic atrophy and small vessel ischemic changes. Normal alignment of the cervical spine. Diffuse degenerative changes. No acute bony abnormalities. Electronically Signed   By: Burman Nieves M.D.   On: 10/14/2015 02:06   Ct Cervical Spine Wo Contrast  10/14/2015  CLINICAL DATA:  Witnessed fall at home. Did  not hit head. Left lower femoral fracture. EXAM: CT HEAD WITHOUT CONTRAST CT CERVICAL SPINE WITHOUT CONTRAST  TECHNIQUE: Multidetector CT imaging of the head and cervical spine was performed following the standard protocol without intravenous contrast. Multiplanar CT image reconstructions of the cervical spine were also generated. COMPARISON:  None. FINDINGS: CT HEAD FINDINGS Diffuse cerebral atrophy. Ventricular dilatation consistent with central atrophy. Low-attenuation changes in the deep white matter consistent with small vessel ischemia. No mass effect or midline shift. No abnormal extra-axial fluid collections. Gray-white matter junctions are distinct. Basal cisterns are not effaced. No evidence of acute intracranial hemorrhage. No depressed skull fractures. Partial opacification of some of the mastoid air cells bilaterally. Visualized paranasal sinuses are not opacified. Vascular calcifications. CT CERVICAL SPINE FINDINGS Diffuse bone demineralization. Normal alignment of the cervical spine. Degenerative changes throughout with narrowed cervical interspaces and associated endplate hypertrophic changes. Degenerative changes in the facet joints. No vertebral compression deformities. No prevertebral soft tissue swelling. Benign-appearing cystic lesion in the right lateral mass of C5. C1-2 articulation appears intact. Interstitial infiltrates in the lung apices may indicate edema. Soft tissues are unremarkable. Vascular calcifications. IMPRESSION: No acute intracranial abnormalities. Chronic atrophy and small vessel ischemic changes. Normal alignment of the cervical spine. Diffuse degenerative changes. No acute bony abnormalities. Electronically Signed   By: Burman Nieves M.D.   On: 10/14/2015 02:06   Dg Pelvis Portable  10/14/2015  CLINICAL DATA:  Status post fall, with concern for pelvic injury. Initial encounter. EXAM: PORTABLE PELVIS 1-2 VIEWS COMPARISON:  None. FINDINGS: There is no evidence of fracture or dislocation. Both femoral heads are seated normally within their respective acetabula. The patient is  status post vertebroplasty at the lower lumbar spine. Mild surrounding degenerative change is noted. The sacroiliac joints are unremarkable in appearance. The visualized bowel gas pattern is grossly unremarkable in appearance. Scattered vascular calcifications are seen. IMPRESSION: No evidence of fracture or dislocation. Electronically Signed   By: Roanna Raider M.D.   On: 10/14/2015 01:18   Ct Femur Left Wo Contrast  10/14/2015  CLINICAL DATA:  Witnessed fall at home. Left lower femoral fracture on plain radiographs. EXAM: CT OF THE LEFT FEMUR WITHOUT CONTRAST TECHNIQUE: Multidetector CT imaging was performed according to the standard protocol. Multiplanar CT image reconstructions were also generated. COMPARISON:  Left knee 10/14/2015 FINDINGS: Left total knee arthroplasty. Acute comminuted fractures of the distal left femoral metaphysis. Fracture lines extend to the superior portion of the femoral prosthesis. There is anterior displacement and angulation of the distal fracture fragments with multiple displaced and angulated butterfly fragments. We bone femoral head fragment is rotated anteriorly. Multiple displaced cortical fragments are present with overriding and impaction of fracture fragments. Scattered gas collections are demonstrated in about the fracture fragments and infiltrating into the subcutaneous fat into the adjacent muscle compartments anteriorly and posteriorly. Gas within the fracture is consistent with open fracture. A displaced bone fragment is demonstrated along the superolateral aspect of the left lower leg at the level of the knee with extension to the skin surface consistent with a penetrating fragment. Small left knee effusion. Soft tissue infiltration in the subcutaneous fat and muscles around the left kidney consistent with edema or hematomas. Prominent vascular calcifications. No dislocation of the left hip. Visualization of the left knee is limited due to streak artifact arising  from the femoral prosthesis. IMPRESSION: Acute comminuted fractures of the distal left femoral metaphysis extending to the base of the femoral component of the knee arthroplasty. Diffuse soft tissue gas  is present with a displaced bone fragment demonstrated just beneath the skin surface consistent with an open fracture. Electronically Signed   By: Burman Nieves M.D.   On: 10/14/2015 02:12   Dg Chest Portable 1 View  10/14/2015  CLINICAL DATA:  Status post fall, with concern for chest injury. Initial encounter. EXAM: PORTABLE CHEST 1 VIEW COMPARISON:  None. FINDINGS: The lungs are well-aerated. Mild peribronchial thickening is noted. There is no evidence of focal opacification, pleural effusion or pneumothorax. The cardiomediastinal silhouette is mildly enlarged. No acute osseous abnormalities are seen. IMPRESSION: Mild peribronchial thickening noted. Mild cardiomegaly. Lungs otherwise grossly clear. No displaced rib fracture seen. Electronically Signed   By: Roanna Raider M.D.   On: 10/14/2015 01:17   Dg Knee Left Port  10/14/2015  CLINICAL DATA:  Trauma.  Fall.  Open left knee injury. EXAM: PORTABLE LEFT KNEE - 1-2 VIEW COMPARISON:  None. FINDINGS: Left total knee arthroplasty with patellar femoral component. There is an acute comminuted and displaced fracture of the distal left femoral metaphysis with fracture lines extending to the base of the femoral component of the arthroplasty. There is anterior angulation of the major distal fracture fragments with anterior and medial displacement of fracture fragments. Medial overriding of fracture fragments. Several displaced butterfly fragments are present. Calcifications in the suprapatellar space appear to be well corticated and may be old loose bodies. Small effusion. Proximal tibia and fibula appear intact. IMPRESSION: Acute comminuted and displaced fractures of the distal left femoral metaphysis extending to the base of the to femoral component of the left  knee arthroplasty. Electronically Signed   By: Burman Nieves M.D.   On: 10/14/2015 01:19   Dg Femur Port Min 2 Views Left  10/14/2015  CLINICAL DATA:  Trauma.  [employer].  Left open knee injury. EXAM: LEFT FEMUR PORTABLE 2 VIEWS COMPARISON:  None. FINDINGS: Acute comminuted and displaced fractures of the distal left radial metaphysis extending to the base of the femoral component of a left knee arthroplasty. See additional report of left knee same date. Limited view of the proximal left femur appears intact. No gross evidence of dislocation of the left hip. Vascular calcifications. IMPRESSION: Acute comminuted and displaced fractures of the distal left radial metaphysis. See additional report of left knee. Proximal left femur is grossly intact. Electronically Signed   By: Burman Nieves M.D.   On: 10/14/2015 01:23    A/P:  82 yr old female s/p fall from standing height with significantly comminuted left distal femur fracture (periprosthetic) Open injury with active bleeding noted. Plan on formal I&D of the knee.  Will apply splint for stabilization. Will review case with Dr Charlann Boxer in AM to discuss definitive fracture management Risks reviewed with patient and her daughter - infection, bleeding, nerve damage, death, stroke, paralysis, need for further surgery. Consent obtained.

## 2015-10-14 NOTE — Clinical Social Work Note (Signed)
Clinical Social Work Assessment  Patient Details  Name: [name] [name] MRN: [id number] Date of Birth: [birth year]  Date of referral:  10/14/15               Reason for consult:  Facility Placement                Permission sought to share information with:  Family Supports Permission granted to share information::  Yes, Verbal Permission Granted  Name::      (daughterFreddie [name]- [phone])  Agency::     Relationship::     Contact Information:     Housing/Transportation Living arrangements for the past 2 months:  Single Family Home Source of Information:  Patient, Adult Children Patient Interpreter Needed:  None Criminal Activity/Legal Involvement Pertinent to Current Situation/Hospitalization:  No - Comment as needed Significant Relationships:  Adult Children, [place], [employer] Lives with:  Adult Children Do you feel safe going back to the place where you live?  No Need for family participation in patient care:  Yes (Comment)  Care giving concerns:  Patient may need SNF post ORIF   Social Worker assessment / plan:  CSW met with patient and daughter who live together- patient admitted after a fall and is awaiting surgery to repair  Her femur fx. Discussed SNF with daughter and she is agreeable to this if needed- she would prefer [facility].    Employment status:  Retired Nurse, adult PT Recommendations:  Not assessed at this time Information / Referral to community resources:  [place]  Patient/Family's Response to care:  Daughter is aware and comfortable with plans to proceed with surgery-  Patient/Family's Understanding of and Emotional Response to Diagnosis, Current Treatment, and Prognosis:  Good/appropriate given age and rehab potential. o Emotional Assessment Appearance:  Developmentally appropriate Attitude/Demeanor/Rapport:    Affect (typically observed):  Accepting, Appropriate Orientation:  Oriented to  Self Alcohol / Substance use:  Not Applicable Psych involvement (Current and /or in the community):  No (Comment)  Discharge Needs  Concerns to be addressed:  Discharge Planning Concerns Readmission within the last 30 days:  No Current discharge risk:  None Barriers to Discharge:  No Barriers Identified   [name] Clarks, LCSW 10/14/2015, 2:30 PM

## 2015-10-14 NOTE — ED Notes (Addendum)
PER EMS: pt from home, brought in via [facility] due to mechanical fall. Pt tripped and landed face first, open knee fracture. Level II trauma activated.Pt was A&OX4 with EMS. fentanyl given via EMS en route. BP-106 palpated, HR-54, O2-96% 2L [place]. Pt is on blood thinners.

## 2015-10-14 NOTE — ED Notes (Signed)
Knee immobilizer placed on left leg by ortho

## 2015-10-14 NOTE — Care Management (Signed)
Utilization review completed. Renel Ende, RN Case Manager [phone]. 

## 2015-10-14 NOTE — Progress Notes (Signed)
Chaplain was paged for level 2 trauma for 82 year old fall with broken leg with extensive bleeding. Chaplain was asked to support family. Doctor had immediate questions of family therefore daughter ws escorted by to trauma room. Chaplain stayed with daughter in the hallway and offer and conducted prayer with daughter. Daughter was permitted at bedside and said there was no other needs. Chaplain advise charge nurse of status and was released   10/14/15 0700  Clinical Encounter Type  Visited With Family  Visit Type Trauma  Referral From Nurse  Spiritual Encounters  Spiritual Needs Prayer;Emotional  Stress Factors  Patient Stress Factors Not reviewed  Family Stress Factors None identified

## 2015-10-15 ENCOUNTER — Inpatient Hospital Stay (HOSPITAL_COMMUNITY): Payer: Medicare Other

## 2015-10-15 ENCOUNTER — Encounter (HOSPITAL_COMMUNITY): Payer: Self-pay | Admitting: Emergency Medicine

## 2015-10-15 ENCOUNTER — Encounter (HOSPITAL_COMMUNITY): Payer: Self-pay | Admitting: Orthopedic Surgery

## 2015-10-15 DIAGNOSIS — J439 Emphysema, unspecified: Secondary | ICD-10-CM

## 2015-10-15 DIAGNOSIS — I4891 Unspecified atrial fibrillation: Secondary | ICD-10-CM

## 2015-10-15 LAB — CBC
HCT: 29.4 % — ABNORMAL LOW (ref 36.0–46.0)
Hemoglobin: 8.9 g/dL — ABNORMAL LOW (ref 12.0–15.0)
MCH: 23.2 pg — AB (ref 26.0–34.0)
MCHC: 30.3 g/dL (ref 30.0–36.0)
MCV: 76.8 fL — ABNORMAL LOW (ref 78.0–100.0)
PLATELETS: 268 10*3/uL (ref 150–400)
RBC: 3.83 MIL/uL — AB (ref 3.87–5.11)
RDW: 16.7 % — AB (ref 11.5–15.5)
WBC: 7.9 10*3/uL (ref 4.0–10.5)

## 2015-10-15 LAB — GLUCOSE, CAPILLARY
GLUCOSE-CAPILLARY: 192 mg/dL — AB (ref 65–99)
GLUCOSE-CAPILLARY: 187 mg/dL — AB (ref 65–99)
Glucose-Capillary: 207 mg/dL — ABNORMAL HIGH (ref 65–99)
Glucose-Capillary: 162 mg/dL — ABNORMAL HIGH (ref 65–99)

## 2015-10-15 LAB — BASIC METABOLIC PANEL
Anion gap: 14 (ref 5–15)
BUN: 6 mg/dL (ref 6–20)
CALCIUM: 8 mg/dL — AB (ref 8.9–10.3)
CO2: 25 mmol/L (ref 22–32)
CREATININE: 0.71 mg/dL (ref 0.44–1.00)
Chloride: 96 mmol/L — ABNORMAL LOW (ref 101–111)
GFR calc Af Amer: >60 mL/min (ref >60)
Glucose, Bld: 201 mg/dL — ABNORMAL HIGH (ref 65–99)
Potassium: 3.9 mmol/L (ref 3.5–5.1)
SODIUM: 135 mmol/L (ref 135–145)

## 2015-10-15 LAB — HEMOGLOBIN AND HEMATOCRIT, BLOOD
HEMATOCRIT: 31.1 % — AB (ref 36.0–46.0)
Hemoglobin: 9.4 g/dL — ABNORMAL LOW (ref 12.0–15.0)

## 2015-10-15 LAB — PREPARE RBC (CROSSMATCH)

## 2015-10-15 LAB — HEMOGLOBIN A1C
Hgb A1c MFr Bld: 8.3 % — ABNORMAL HIGH (ref 4.8–5.6)
MEAN PLASMA GLUCOSE: 192 mg/dL

## 2015-10-15 MED ORDER — DORZOLAMIDE HCL-TIMOLOL MAL 2-0.5 % OP SOLN
1.0000 [drp] | Freq: Two times a day (BID) | OPHTHALMIC | Status: DC
  Administered 2015-10-15 – 2015-10-18 (×5): 1 [drp] via OPHTHALMIC
  Filled 2015-10-15 (×2): qty 10

## 2015-10-15 MED ORDER — ACETAMINOPHEN 325 MG PO TABS: 650.0000 mg | ORAL_TABLET | Freq: Four times a day (QID) | ORAL | Status: DC | PRN

## 2015-10-15 MED ORDER — SODIUM CHLORIDE 0.9 % IV SOLN: Freq: Once | INTRAVENOUS | Status: DC

## 2015-10-15 MED ORDER — ENOXAPARIN SODIUM 40 MG/0.4ML ~~LOC~~ SOLN
40.0000 mg | Freq: Once | SUBCUTANEOUS | Status: DC
  Filled 2015-10-15: qty 0.4

## 2015-10-15 MED ORDER — LEVALBUTEROL HCL 0.63 MG/3ML IN NEBU: 0.6300 mg | INHALATION_SOLUTION | RESPIRATORY_TRACT | Status: DC | PRN

## 2015-10-15 MED ORDER — VANCOMYCIN HCL IN DEXTROSE 1-5 GM/200ML-% IV SOLN
1000.0000 mg | INTRAVENOUS | Status: AC
  Administered 2015-10-16: 1000 mg via INTRAVENOUS
  Filled 2015-10-15: qty 200

## 2015-10-15 MED ORDER — PANTOPRAZOLE SODIUM 40 MG PO TBEC
40.0000 mg | DELAYED_RELEASE_TABLET | Freq: Every day | ORAL | Status: DC
  Administered 2015-10-15 – 2015-10-23 (×7): 40 mg via ORAL
  Filled 2015-10-15 (×9): qty 1

## 2015-10-15 MED ORDER — TIOTROPIUM BROMIDE MONOHYDRATE 18 MCG IN CAPS
18.0000 ug | ORAL_CAPSULE | Freq: Every day | RESPIRATORY_TRACT | Status: DC
  Administered 2015-10-15 – 2015-10-21 (×5): 18 ug via RESPIRATORY_TRACT
  Filled 2015-10-15 (×2): qty 5

## 2015-10-15 MED ORDER — PRAVASTATIN SODIUM 40 MG PO TABS
40.0000 mg | ORAL_TABLET | Freq: Every day | ORAL | Status: DC
  Administered 2015-10-17 – 2015-10-18 (×2): 40 mg via ORAL
  Filled 2015-10-15 (×2): qty 1

## 2015-10-15 MED ORDER — POLYVINYL ALCOHOL 1.4 % OP SOLN
1.0000 [drp] | Freq: Four times a day (QID) | OPHTHALMIC | Status: DC | PRN
  Administered 2015-10-20: 1 [drp] via OPHTHALMIC
  Filled 2015-10-15: qty 15

## 2015-10-15 MED ORDER — CITALOPRAM HYDROBROMIDE 10 MG PO TABS
10.0000 mg | ORAL_TABLET | Freq: Every day | ORAL | Status: DC
  Administered 2015-10-15 – 2015-10-21 (×5): 10 mg via ORAL
  Filled 2015-10-15 (×7): qty 1

## 2015-10-15 MED ORDER — INSULIN ASPART 100 UNIT/ML ~~LOC~~ SOLN
0.0000 [IU] | Freq: Three times a day (TID) | SUBCUTANEOUS | Status: DC
  Administered 2015-10-15: 3 [IU] via SUBCUTANEOUS
  Administered 2015-10-15 – 2015-10-18 (×6): 5 [IU] via SUBCUTANEOUS
  Administered 2015-10-18: 8 [IU] via SUBCUTANEOUS
  Administered 2015-10-19 – 2015-10-20 (×6): 3 [IU] via SUBCUTANEOUS
  Administered 2015-10-21: 5 [IU] via SUBCUTANEOUS
  Administered 2015-10-21: 3 [IU] via SUBCUTANEOUS
  Administered 2015-10-21: 5 [IU] via SUBCUTANEOUS

## 2015-10-15 MED ORDER — ENOXAPARIN SODIUM 40 MG/0.4ML ~~LOC~~ SOLN: 40.0000 mg | SUBCUTANEOUS | Status: DC

## 2015-10-15 MED ORDER — INSULIN ASPART 100 UNIT/ML ~~LOC~~ SOLN
0.0000 [IU] | Freq: Every day | SUBCUTANEOUS | Status: DC
  Administered 2015-10-16: 0 [IU] via SUBCUTANEOUS
  Administered 2015-10-17 – 2015-10-18 (×2): 2 [IU] via SUBCUTANEOUS

## 2015-10-15 NOTE — Progress Notes (Signed)
[facility] Progress Note   [name] [name]  YNW:[id number]  DOB: 20-Dec-1921  DOA: 10/14/2015 PCP: [name], MD  Brief narrative: [name] [name] is a 82 y.o. female PMH of af-fib on Xarelto, HTN, COPD, DM, s/p bilateral knee replacement by [name] 5-7 years ago, GERD, depression, congestive heart failure, who presents with left knee pain after fall. She is found to have a periprothetic knee fracture   Subjective: No complaints of pain nausea vomiting diarrhea constiaption or dyspnea.   Assessment/Plan: Open periprosthetic femur fracture, left - management per ortho- will go to OR tomorrow  Anemia - due to acute blood loss from knee fracture? receiving blood per ortho today  HTN - currently normotensive- cont to hold Verapamil  Leukocytosis:  Likely due to stress-induced demargination. Patient does not have signs of infection and WBC has normalized  Atrial Fibrillation:  CHA2DS2-VASc Score is 6- Patient is on Xarelto at home.  - resume Xarelto after surgery per ortho  DM-II:  - Last A1c not on record. Patient is taking metformin and Januvia at home -SSI while here - A1c is 8.3  COPD:  - stable. -Continue Xopenex inhaler, Spiriva inhaler  CHF (congestive heart failure) (HCC):  - No 2-D echo on record, not sure which type of CHF. Patient is on Lasix. No leg edema. CHF is compensated.  -Hold her Lasix for now as she is compensated  HLD: - Last LDL was not on record- current LDL is 121 -Continue home medications: Pravastatin  GERD: -Protonix  Depression:  -Stable, no suicidal or homicidal ideations. -Continue home medications: Celexa      Antibiotics: Anti-infectives    Start     Dose/Rate Route Frequency Ordered Stop   10/15/15 0815  vancomycin (VANCOCIN) IVPB 1000 mg/200 mL premix     1,000 mg 200 mL/hr over 60 Minutes Intravenous To [facility] 10/15/15 0800 10/16/15 0815   10/14/15 1500  vancomycin (VANCOCIN) IVPB 1000 mg/200 mL premix      1,000 mg 200 mL/hr over 60 Minutes Intravenous Every 12 hours 10/14/15 0603 10/14/15 1811   10/14/15 0315  vancomycin (VANCOCIN) IVPB 1000 mg/200 mL premix     1,000 mg 200 mL/hr over 60 Minutes Intravenous  Once 10/14/15 0305 10/14/15 0416     Code Status:     Code Status Orders        Start     Ordered   10/14/15 0524  Do not attempt resuscitation (DNR)   Continuous    Question Answer Comment  In the event of cardiac or respiratory ARREST Do not call a "code blue"   In the event of cardiac or respiratory ARREST Do not perform Intubation, CPR, defibrillation or ACLS   In the event of cardiac or respiratory ARREST Use medication by any route, position, wound care, and other measures to relive pain and suffering. May use oxygen, suction and manual treatment of airway obstruction as needed for comfort.      10/14/15 0523    Code Status History    Date Active Date Inactive Code Status Order ID Comments User Context   10/14/2015  3:41 AM 10/14/2015  5:23 AM DNR [id number]  Lorretta Harp, MD ED    Advance Directive Documentation        Most Recent Value   Type of Advance Directive  Healthcare Power of Attorney, Living will   Pre-existing out of facility DNR order (yellow form or pink MOST form)     "MOST" Form in Place?  Family Communication: daughter at bedside Disposition Plan: will need SNF  DVT prophylaxis: Lovenox Consultants: ortho Procedures:     Objective: Filed Weights   10/14/15 0056  Weight: 67.132 kg (148 lb)    Intake/Output Summary (Last 24 hours) at 10/15/15 1035 Last data filed at 10/15/15 1006  Gross per 24 hour  Intake 1098.5 ml  Output   1900 ml  Net -801.5 ml     Vitals Filed Vitals:   10/14/15 2105 10/15/15 0453 10/15/15 1002 10/15/15 1021  BP: 119/57 125/57 117/56 124/57  Pulse: 56 62 69 68  Temp: 98.6 F (37 C) 97.7 F (36.5 C) 98.3 F (36.8 C) 98.3 F (36.8 C)  TempSrc: Oral Oral Oral Oral  Resp: Height:       Weight:      SpO2: 95% 94% 94% 96%    Exam:  General:  Pt is alert, not in acute distress  HEENT: No icterus, No thrush, oral mucosa moist  Cardiovascular: regular rate and rhythm, S1/S2 No murmur  Respiratory: clear to auscultation bilaterally   Abdomen: Soft, +Bowel sounds, non tender, non distended, no guarding  MSK: No cyanosis or clubbing- no pedal edema- left leg in splint    Data Reviewed: Basic Metabolic Panel:  Recent Labs Lab 10/14/15 0052 10/14/15 0058 10/14/15 0535 10/15/15 0500  NA 135 136 135 135  K 4.4 4.3 4.2 3.9  CL 101 101 99* 96*  CO2 22  --  24 25  GLUCOSE 252* 249* 296* 201*  BUN CREATININE 0.62 0.60 0.73 0.71  CALCIUM 8.9  --  8.4* 8.0*   Liver Function Tests:  Recent Labs Lab 10/14/15 0052 10/14/15 0535  AST 17 18  ALT 11* 12*  ALKPHOS 66 54  BILITOT 0.4 0.7  PROT 6.5 5.7*  ALBUMIN 3.5 3.0*   No results for input(s): LIPASE, AMYLASE in the last 168 hours. No results for input(s): AMMONIA in the last 168 hours. CBC:  Recent Labs Lab 10/14/15 0052 10/14/15 0058 10/14/15 0535 10/15/15 0500  WBC 10.9*  --  11.4* 7.9  HGB 12.3 14.3 10.9* 8.9*  HCT 40.1 42.0 36.1 29.4*  MCV 76.8*  --  77.5* 76.8*  PLT 354  --  273 268   Cardiac Enzymes: No results for input(s): CKTOTAL, CKMB, CKMBINDEX, TROPONINI in the last 168 hours. BNP (last 3 results)  Recent Labs  10/14/15 0535  BNP 141.5*    ProBNP (last 3 results) No results for input(s): PROBNP in the last 8760 hours.  CBG:  Recent Labs Lab 10/14/15 0637 10/14/15 1211 10/14/15 1637 10/14/15 2111 10/15/15 0755  GLUCAP 266* 265* 255* 231* 207*    No results found for this or any previous visit (from the past 240 hour(s)).   Studies: Ct Head Wo Contrast  10/14/2015  CLINICAL DATA:  Witnessed fall at home. Did not hit head. Left lower femoral fracture. EXAM: CT HEAD WITHOUT CONTRAST CT CERVICAL SPINE WITHOUT CONTRAST TECHNIQUE: Multidetector CT imaging  of the head and cervical spine was performed following the standard protocol without intravenous contrast. Multiplanar CT image reconstructions of the cervical spine were also generated. COMPARISON:  None. FINDINGS: CT HEAD FINDINGS Diffuse cerebral atrophy. Ventricular dilatation consistent with central atrophy. Low-attenuation changes in the deep white matter consistent with small vessel ischemia. No mass effect or midline shift. No abnormal extra-axial fluid collections. Gray-white matter junctions are distinct. Basal cisterns are not effaced. No evidence of acute intracranial hemorrhage.  No depressed skull fractures. Partial opacification of some of the mastoid air cells bilaterally. Visualized paranasal sinuses are not opacified. Vascular calcifications. CT CERVICAL SPINE FINDINGS Diffuse bone demineralization. Normal alignment of the cervical spine. Degenerative changes throughout with narrowed cervical interspaces and associated endplate hypertrophic changes. Degenerative changes in the facet joints. No vertebral compression deformities. No prevertebral soft tissue swelling. Benign-appearing cystic lesion in the right lateral mass of C5. C1-2 articulation appears intact. Interstitial infiltrates in the lung apices may indicate edema. Soft tissues are unremarkable. Vascular calcifications. IMPRESSION: No acute intracranial abnormalities. Chronic atrophy and small vessel ischemic changes. Normal alignment of the cervical spine. Diffuse degenerative changes. No acute bony abnormalities. Electronically Signed   BBurman Nievestevens M.D.   On: 10/14/2015 02:06   Ct Cervical Spine Wo Contrast  10/14/2015  CLINICAL DATA:  Witnessed fall at home. Did not hit head. Left lower femoral fracture. EXAM: CT HEAD WITHOUT CONTRAST CT CERVICAL SPINE WITHOUT CONTRAST TECHNIQUE: Multidetector CT imaging of the head and cervical spine was performed following the standard protocol without intravenous contrast. Multiplanar CT  image reconstructions of the cervical spine were also generated. COMPARISON:  None. FINDINGS: CT HEAD FINDINGS Diffuse cerebral atrophy. Ventricular dilatation consistent with central atrophy. Low-attenuation changes in the deep white matter consistent with small vessel ischemia. No mass effect or midline shift. No abnormal extra-axial fluid collections. Gray-white matter junctions are distinct. Basal cisterns are not effaced. No evidence of acute intracranial hemorrhage. No depressed skull fractures. Partial opacification of some of the mastoid air cells bilaterally. Visualized paranasal sinuses are not opacified. Vascular calcifications. CT CERVICAL SPINE FINDINGS Diffuse bone demineralization. Normal alignment of the cervical spine. Degenerative changes throughout with narrowed cervical interspaces and associated endplate hypertrophic changes. Degenerative changes in the facet joints. No vertebral compression deformities. No prevertebral soft tissue swelling. Benign-appearing cystic lesion in the right lateral mass of C5. C1-2 articulation appears intact. Interstitial infiltrates in the lung apices may indicate edema. Soft tissues are unremarkable. Vascular calcifications. IMPRESSION: No acute intracranial abnormalities. Chronic atrophy and small vessel ischemic changes. Normal alignment of the cervical spine. Diffuse degenerative changes. No acute bony abnormalities. Electronically Signed   By: Burman Nieves M.D.   On: 10/14/2015 02:06   Dg Pelvis Portable  10/14/2015  CLINICAL DATA:  Status post fall, with concern for pelvic injury. Initial encounter. EXAM: PORTABLE PELVIS 1-2 VIEWS COMPARISON:  None. FINDINGS: There is no evidence of fracture or dislocation. Both femoral heads are seated normally within their respective acetabula. The patient is status post vertebroplasty at the lower lumbar spine. Mild surrounding degenerative change is noted. The sacroiliac joints are unremarkable in appearance. The  visualized bowel gas pattern is grossly unremarkable in appearance. Scattered vascular calcifications are seen. IMPRESSION: No evidence of fracture or dislocation. Electronically Signed   By: Roanna Raider M.D.   On: 10/14/2015 01:18   Ct Femur Left Wo Contrast  10/14/2015  CLINICAL DATA:  Witnessed fall at home. Left lower femoral fracture on plain radiographs. EXAM: CT OF THE LEFT FEMUR WITHOUT CONTRAST TECHNIQUE: Multidetector CT imaging was performed according to the standard protocol. Multiplanar CT image reconstructions were also generated. COMPARISON:  Left knee 10/14/2015 FINDINGS: Left total knee arthroplasty. Acute comminuted fractures of the distal left femoral metaphysis. Fracture lines extend to the superior portion of the femoral prosthesis. There is anterior displacement and angulation of the distal fracture fragments with multiple displaced and angulated butterfly fragments. We bone femoral head fragment is rotated anteriorly. Multiple displaced cortical fragments  are present with overriding and impaction of fracture fragments. Scattered gas collections are demonstrated in about the fracture fragments and infiltrating into the subcutaneous fat into the adjacent muscle compartments anteriorly and posteriorly. Gas within the fracture is consistent with open fracture. A displaced bone fragment is demonstrated along the superolateral aspect of the left lower leg at the level of the knee with extension to the skin surface consistent with a penetrating fragment. Small left knee effusion. Soft tissue infiltration in the subcutaneous fat and muscles around the left kidney consistent with edema or hematomas. Prominent vascular calcifications. No dislocation of the left hip. Visualization of the left knee is limited due to streak artifact arising from the femoral prosthesis. IMPRESSION: Acute comminuted fractures of the distal left femoral metaphysis extending to the base of the femoral component of the  knee arthroplasty. Diffuse soft tissue gas is present with a displaced bone fragment demonstrated just beneath the skin surface consistent with an open fracture. Electronically Signed   By: Burman Nieves M.D.   On: 10/14/2015 02:12   Dg Chest Portable 1 View  10/14/2015  CLINICAL DATA:  Status post fall, with concern for chest injury. Initial encounter. EXAM: PORTABLE CHEST 1 VIEW COMPARISON:  None. FINDINGS: The lungs are well-aerated. Mild peribronchial thickening is noted. There is no evidence of focal opacification, pleural effusion or pneumothorax. The cardiomediastinal silhouette is mildly enlarged. No acute osseous abnormalities are seen. IMPRESSION: Mild peribronchial thickening noted. Mild cardiomegaly. Lungs otherwise grossly clear. No displaced rib fracture seen. Electronically Signed   By: Roanna Raider M.D.   On: 10/14/2015 01:17   Dg Knee Left Port  10/14/2015  CLINICAL DATA:  Trauma.  Fall.  Open left knee injury. EXAM: PORTABLE LEFT KNEE - 1-2 VIEW COMPARISON:  None. FINDINGS: Left total knee arthroplasty with patellar femoral component. There is an acute comminuted and displaced fracture of the distal left femoral metaphysis with fracture lines extending to the base of the femoral component of the arthroplasty. There is anterior angulation of the major distal fracture fragments with anterior and medial displacement of fracture fragments. Medial overriding of fracture fragments. Several displaced butterfly fragments are present. Calcifications in the suprapatellar space appear to be well corticated and may be old loose bodies. Small effusion. Proximal tibia and fibula appear intact. IMPRESSION: Acute comminuted and displaced fractures of the distal left femoral metaphysis extending to the base of the to femoral component of the left knee arthroplasty. Electronically Signed   By: Burman Nieves M.D.   On: 10/14/2015 01:19   Dg Femur Port Min 2 Views Left  10/14/2015  CLINICAL DATA:   Trauma.  [employer].  Left open knee injury. EXAM: LEFT FEMUR PORTABLE 2 VIEWS COMPARISON:  None. FINDINGS: Acute comminuted and displaced fractures of the distal left radial metaphysis extending to the base of the femoral component of a left knee arthroplasty. See additional report of left knee same date. Limited view of the proximal left femur appears intact. No gross evidence of dislocation of the left hip. Vascular calcifications. IMPRESSION: Acute comminuted and displaced fractures of the distal left radial metaphysis. See additional report of left knee. Proximal left femur is grossly intact. Electronically Signed   By: Burman Nieves M.D.   On: 10/14/2015 01:23    Scheduled Meds:  Scheduled Meds: . sodium chloride   Intravenous Once  . citalopram  10 mg Oral Daily  . dorzolamide-timolol  1 drop Both Eyes BID  . ferrous sulfate  325 mg Oral TID  PC  . insulin aspart  0-15 Units Subcutaneous TID WC  . insulin aspart  0-5 Units Subcutaneous QHS  . pantoprazole  40 mg Oral Daily  . pravastatin  40 mg Oral q1800  . senna-docusate  2 tablet Oral QHS  . tiotropium  18 mcg Inhalation Daily  . vancomycin  1,000 mg Intravenous To SS-Surg   Continuous Infusions: . lactated ringers Stopped (10/15/15 0018)    Time spent on care of this patient: 35 min   Ionna Avis, MD 10/15/2015, 10:35 AM  LOS: 1 day   [facility] Office  [phone] Pager - Text Page per [email or web address] If 7PM-7AM, please contact night-coverage [email or web address]

## 2015-10-15 NOTE — Progress Notes (Signed)
Started blood transfusion at 10:06, about 20 min into transfusion pt c/o her chest hurting. Stopped blood, checked vitals, and notified MD. VS were stable and upon further investigation pt stated that she the pain felt "more sore" and was related to her fall PTA. Pt's daughter feels that the pt "got a bit anxious because she's never had blood before". Received verbal orders from MD to resume transfusion and as well as an ordered for a CXR. Pt now resting comfortably w/o complaint. Will continue to monitor.    10/15/15 1021  Vitals  Temp 98.3 F (36.8 C)  Temp Source Oral  BP (!) 124/57 mmHg  Pulse Rate 68  Resp 16  Oxygen Therapy  SpO2 96 %  O2 Device Nasal Cannula  O2 Flow Rate (L/min) 2 L/min

## 2015-10-15 NOTE — Progress Notes (Signed)
Asked to assume care by Dr. Shon Baton. Patient seen and examined, chart reviewed. Hgb 8.9. Last xarelto was Monday night.  LLE: splint c/d/i. Wiggles toes, BCR toes, SILT toes.  Open L periprosthetic femur fx s/p I&D -plan for OR tomorrow for repeat I&D, surgical stabilization -NPO after MN -transfuse 1 unit PRBCs today in preparation for surgery tomorrow -hold blood thinners -bed rest for now   The risks, benefits, and alternatives were discussed with the patient. There are risks associated with the surgery including, but not limited to, problems with anesthesia (death), infection, differences in leg length/angulation/rotation, fracture of bones, loosening or failure of implants, malunion, nonunion, hematoma (blood accumulation) which may require surgical drainage, blood clots, pulmonary embolism, nerve injury (foot drop), and blood vessel injury. The patient understands these risks and elects to proceed.

## 2015-10-15 NOTE — Anesthesia Preprocedure Evaluation (Deleted)
Anesthesia Evaluation  Patient identified by MRN, date of birth, ID band Patient awake    Reviewed: Allergy & Precautions, H&P , NPO status , Patient's Chart, lab work & pertinent test results  Airway Mallampati: II  TM Distance: >3 FB Neck ROM: full    Dental no notable dental hx. (+) Dental Advisory Given   Pulmonary neg pulmonary ROS, COPD,    Pulmonary exam normal breath sounds clear to auscultation       Cardiovascular Exercise Tolerance: Good hypertension, Pt. on medications + Past MI  negative cardio ROS Normal cardiovascular exam+ dysrhythmias Atrial Fibrillation  Rhythm:regular Rate:Normal     Neuro/Psych glaucoma CVA, Residual Symptoms negative neurological ROS  negative psych ROS   GI/Hepatic negative GI ROS, Neg liver ROS,   Endo/Other  negative endocrine ROSdiabetes, Well Controlled, Type 2, Oral Hypoglycemic Agents  Renal/GU negative Renal ROS  negative genitourinary   Musculoskeletal   Abdominal   Peds  Hematology negative hematology ROS (+)   Anesthesia Other Findings   Reproductive/Obstetrics negative OB ROS                             Anesthesia Physical Anesthesia Plan  ASA: III  Anesthesia Plan: General   Post-op Pain Management:    Induction: Intravenous  Airway Management Planned: Oral ETT  Additional Equipment:   Intra-op Plan:   Post-operative Plan: Extubation in OR  Informed Consent: I have reviewed the patients History and Physical, chart, labs and discussed the procedure including the risks, benefits and alternatives for the proposed anesthesia with the patient or authorized representative who has indicated his/her understanding and acceptance.   Dental Advisory Given  Plan Discussed with: CRNA and Surgeon  Anesthesia Plan Comments:         Anesthesia Quick Evaluation

## 2015-10-15 NOTE — NC FL2 (Signed)
[place] MEDICAID FL2 LEVEL OF CARE SCREENING TOOL     IDENTIFICATION  Patient Name: [name] [name] Birthdate: [birth year] Sex: female Admission Date (Current Location): 10/14/2015  [facility] and [place] Number:  [employer] and Address:  The . [facility], [address] [place], [place] [address]      Provider Number: [id number]  Attending Physician Name and Address:  Calvert Cantor, MD  Relative Name and Phone Number:       Current Level of Care: Hospital Recommended Level of Care: Skilled Nursing Facility Prior Approval Number:    Date Approved/Denied:   PASRR Number: [id number]  Discharge Plan: SNF    Current Diagnoses: Patient Active Problem List   Diagnosis Date Noted  . Open femur fracture, left (HCC) 10/14/2015  . Femur fracture, left (HCC) 10/14/2015  . Diabetes mellitus without complication (HCC)   . COPD (chronic obstructive pulmonary disease) (HCC)   . Atrial fibrillation (HCC)   . CHF (congestive heart failure) (HCC)   . HLD (hyperlipidemia)   . GERD (gastroesophageal reflux disease)   . Fall   . Chronic obstructive pulmonary disease (HCC)     Orientation RESPIRATION BLADDER Height & Weight    Self, Situation, Place  O2 (2L) Indwelling catheter  (160 cm) 148 lbs.  BEHAVIORAL SYMPTOMS/MOOD NEUROLOGICAL BOWEL NUTRITION STATUS      Continent Diet (Heart Healthy / Carb Modified)  AMBULATORY STATUS COMMUNICATION OF NEEDS Skin   Extensive Assist Verbally Surgical wounds (Left Knee Incision)                       Personal Care Assistance Level of Assistance  Bathing, Feeding, Dressing Bathing Assistance: Maximum assistance Feeding assistance: Independent Dressing Assistance: Maximum assistance     Functional Limitations Info  Sight, Hearing, Speech Sight Info: Impaired Hearing Info: Adequate Speech Info: Adequate    SPECIAL CARE FACTORS FREQUENCY  PT (By licensed PT), OT (By licensed OT)     PT Frequency:  3 OT Frequency: 3            Contractures Contractures Info: Not present    Additional Factors Info  Code Status, Allergies, Insulin Sliding Scale Code Status Info: DNR Allergies Info: Penicillins   Insulin Sliding Scale Info: Sliding Scale 3 times with meals and bedtime       Current Medications (10/15/2015):  This is the current hospital active medication list Current Facility-Administered Medications  Medication Dose Route Frequency Provider Last Rate Last Dose  . 0.9 %  sodium chloride infusion   Intravenous Once Samson Frederic, MD      . acetaminophen (TYLENOL) tablet 650 mg  650 mg Oral Q6H PRN Venita Lick, MD   650 mg at 10/15/15 0813   Or  . acetaminophen (TYLENOL) suppository 650 mg  650 mg Rectal Q6H PRN Venita Lick, MD      . dorzolamide-timolol (COSOPT) 22.3-6.8 MG/ML ophthalmic solution 1 drop  1 drop Both Eyes BID Calvert Cantor, MD      . ferrous sulfate tablet 325 mg  325 mg Oral TID PC Venita Lick, MD   325 mg at 10/14/15 1846  . insulin aspart (novoLOG) injection 0-15 Units  0-15 Units Subcutaneous TID WC Calvert Cantor, MD   5 Units at 10/15/15 0813  . insulin aspart (novoLOG) injection 0-5 Units  0-5 Units Subcutaneous QHS Calvert Cantor, MD      . lactated ringers infusion   Intravenous Continuous Venita Lick, MD  Stopped at 10/15/15 0018  . menthol-cetylpyridinium (CEPACOL) lozenge 3 mg  1 lozenge Oral PRN Venita Lick, MD       Or  . phenol (CHLORASEPTIC) mouth spray 1 spray  1 spray Mouth/Throat PRN Venita Lick, MD      . methocarbamol (ROBAXIN) tablet 500 mg  500 mg Oral Q6H PRN Venita Lick, MD       Or  . methocarbamol (ROBAXIN) 500 mg in dextrose 5 % 50 mL IVPB  500 mg Intravenous Q6H PRN Venita Lick, MD   500 mg at 10/14/15 1930  . metoCLOPramide (REGLAN) tablet 5-10 mg  5-10 mg Oral Q8H PRN Venita Lick, MD       Or  . metoCLOPramide (REGLAN) injection 5-10 mg  5-10 mg Intravenous Q8H PRN Venita Lick, MD      . morphine 2 MG/ML  injection 0.5 mg  0.5 mg Intravenous Q2H PRN Venita Lick, MD      . ondansetron [facility]) tablet 4 mg  4 mg Oral Q6H PRN Venita Lick, MD       Or  . ondansetron [facility]) injection 4 mg  4 mg Intravenous Q6H PRN Venita Lick, MD      . oxyCODONE (Oxy IR/ROXICODONE) immediate release tablet 5-10 mg  5-10 mg Oral Q4H PRN Venita Lick, MD   10 mg at 10/14/15 1407  . senna-docusate (Senokot-S) tablet 2 tablet  2 tablet Oral QHS Calvert Cantor, MD   2 tablet at 10/14/15 2127  . vancomycin (VANCOCIN) IVPB 1000 mg/200 mL premix  1,000 mg Intravenous To SS-Surg Samson Frederic, MD         Discharge Medications: Please see discharge summary for a list of discharge medications.  Relevant Imaging Results:  Relevant Lab Results:   Additional Information SSN [id number]  Macario Golds, [place] [phone]

## 2015-10-16 ENCOUNTER — Inpatient Hospital Stay (HOSPITAL_COMMUNITY): Payer: Medicare Other

## 2015-10-16 ENCOUNTER — Encounter (HOSPITAL_COMMUNITY): Admission: AD | Payer: Self-pay | Source: Ambulatory Visit

## 2015-10-16 ENCOUNTER — Inpatient Hospital Stay (HOSPITAL_COMMUNITY): Payer: Medicare Other | Admitting: Anesthesiology

## 2015-10-16 ENCOUNTER — Encounter (HOSPITAL_COMMUNITY)
Admission: EM | Disposition: A | Discharge status: Skilled Nursing Facility | Payer: Self-pay | Source: Home / Self Care | Attending: Internal Medicine

## 2015-10-16 ENCOUNTER — Inpatient Hospital Stay (HOSPITAL_COMMUNITY): Admission: AD | Admit: 2015-10-16 | Payer: Medicare Other | Source: Ambulatory Visit | Admitting: Orthopedic Surgery

## 2015-10-16 DIAGNOSIS — S7292XB Unspecified fracture of left femur, initial encounter for open fracture type I or II: Secondary | ICD-10-CM | POA: Diagnosis present

## 2015-10-16 DIAGNOSIS — E785 Hyperlipidemia, unspecified: Secondary | ICD-10-CM

## 2015-10-16 DIAGNOSIS — S72002Q Fracture of unspecified part of neck of left femur, subsequent encounter for open fracture type I or II with malunion: Secondary | ICD-10-CM

## 2015-10-16 HISTORY — PX: ORIF FEMUR FRACTURE: SHX2119

## 2015-10-16 LAB — BASIC METABOLIC PANEL
Anion gap: 4 — ABNORMAL LOW (ref 5–15)
BUN: 7 mg/dL (ref 6–20)
CO2: 29 mmol/L (ref 22–32)
CREATININE: 0.69 mg/dL (ref 0.44–1.00)
Calcium: 8.5 mg/dL — ABNORMAL LOW (ref 8.9–10.3)
Chloride: 104 mmol/L (ref 101–111)
GFR calc Af Amer: >60 mL/min (ref >60)
Glucose, Bld: 218 mg/dL — ABNORMAL HIGH (ref 65–99)
Potassium: 3.7 mmol/L (ref 3.5–5.1)
SODIUM: 137 mmol/L (ref 135–145)

## 2015-10-16 LAB — CBC
HCT: 32.1 % — ABNORMAL LOW (ref 36.0–46.0)
Hemoglobin: 9.8 g/dL — ABNORMAL LOW (ref 12.0–15.0)
MCH: 23.9 pg — AB (ref 26.0–34.0)
MCHC: 30.5 g/dL (ref 30.0–36.0)
MCV: 78.3 fL (ref 78.0–100.0)
PLATELETS: 223 10*3/uL (ref 150–400)
RBC: 4.1 MIL/uL (ref 3.87–5.11)
RDW: 16.6 % — ABNORMAL HIGH (ref 11.5–15.5)
WBC: 8 10*3/uL (ref 4.0–10.5)

## 2015-10-16 LAB — TYPE AND SCREEN
ABO/RH(D): O POS
Antibody Screen: NEGATIVE
UNIT DIVISION: 0

## 2015-10-16 LAB — SURGICAL PCR SCREEN
MRSA, PCR: NEGATIVE
Staphylococcus aureus: NEGATIVE

## 2015-10-16 LAB — GLUCOSE, CAPILLARY
GLUCOSE-CAPILLARY: 246 mg/dL — AB (ref 65–99)
GLUCOSE-CAPILLARY: 228 mg/dL — AB (ref 65–99)
Glucose-Capillary: 245 mg/dL — ABNORMAL HIGH (ref 65–99)

## 2015-10-16 SURGERY — OPEN REDUCTION INTERNAL FIXATION (ORIF) DISTAL FEMUR FRACTURE
Anesthesia: General | Site: Leg Upper | Laterality: Left

## 2015-10-16 SURGERY — OPEN REDUCTION INTERNAL FIXATION (ORIF) DISTAL FEMUR FRACTURE
Anesthesia: Choice | Laterality: Left

## 2015-10-16 MED ORDER — LIDOCAINE HCL (CARDIAC) 20 MG/ML IV SOLN
INTRAVENOUS | Status: AC
  Filled 2015-10-16: qty 5

## 2015-10-16 MED ORDER — VANCOMYCIN HCL IN DEXTROSE 750-5 MG/150ML-% IV SOLN
750.0000 mg | INTRAVENOUS | Status: AC
  Administered 2015-10-17: 750 mg via INTRAVENOUS
  Filled 2015-10-16 (×2): qty 150

## 2015-10-16 MED ORDER — DEXTROSE 5 % IV SOLN
10.0000 mg | INTRAVENOUS | Status: DC | PRN
  Administered 2015-10-16: 15 ug/min via INTRAVENOUS

## 2015-10-16 MED ORDER — ACETAMINOPHEN 650 MG RE SUPP
650.0000 mg | Freq: Four times a day (QID) | RECTAL | Status: DC | PRN
  Administered 2015-10-17 – 2015-10-18 (×4): 650 mg via RECTAL
  Filled 2015-10-16 (×5): qty 1

## 2015-10-16 MED ORDER — ONDANSETRON HCL 4 MG/2ML IJ SOLN
INTRAMUSCULAR | Status: DC | PRN
  Administered 2015-10-16: 4 mg via INTRAVENOUS

## 2015-10-16 MED ORDER — LACTATED RINGERS IV SOLN
INTRAVENOUS | Status: DC | PRN
  Administered 2015-10-16 (×2): via INTRAVENOUS

## 2015-10-16 MED ORDER — ONDANSETRON HCL 4 MG PO TABS: 4.0000 mg | ORAL_TABLET | Freq: Four times a day (QID) | ORAL | Status: DC | PRN

## 2015-10-16 MED ORDER — ACETAMINOPHEN 325 MG PO TABS
650.0000 mg | ORAL_TABLET | Freq: Four times a day (QID) | ORAL | Status: DC | PRN
  Administered 2015-10-18 – 2015-10-23 (×7): 650 mg via ORAL
  Filled 2015-10-16 (×7): qty 2

## 2015-10-16 MED ORDER — VANCOMYCIN HCL IN DEXTROSE 1-5 GM/200ML-% IV SOLN
1000.0000 mg | INTRAVENOUS | Status: AC
  Filled 2015-10-16: qty 200

## 2015-10-16 MED ORDER — FENTANYL CITRATE (PF) 100 MCG/2ML IJ SOLN: 25.0000 ug | INTRAMUSCULAR | Status: DC | PRN

## 2015-10-16 MED ORDER — VANCOMYCIN HCL IN DEXTROSE 1-5 GM/200ML-% IV SOLN
1000.0000 mg | Freq: Two times a day (BID) | INTRAVENOUS | Status: DC
  Filled 2015-10-16: qty 200

## 2015-10-16 MED ORDER — 0.9 % SODIUM CHLORIDE (POUR BTL) OPTIME
TOPICAL | Status: DC | PRN
  Administered 2015-10-16: 1000 mL

## 2015-10-16 MED ORDER — METOCLOPRAMIDE HCL 5 MG PO TABS: 5.0000 mg | ORAL_TABLET | Freq: Three times a day (TID) | ORAL | Status: DC | PRN

## 2015-10-16 MED ORDER — MENTHOL 3 MG MT LOZG
1.0000 | LOZENGE | OROMUCOSAL | Status: DC | PRN
Start: 2015-10-16 — End: 2015-10-23

## 2015-10-16 MED ORDER — SODIUM CHLORIDE 0.9 % IJ SOLN
INTRAMUSCULAR | Status: AC
  Filled 2015-10-16: qty 10

## 2015-10-16 MED ORDER — NEOSTIGMINE METHYLSULFATE 10 MG/10ML IV SOLN
INTRAVENOUS | Status: AC
  Filled 2015-10-16: qty 4

## 2015-10-16 MED ORDER — PROPOFOL 10 MG/ML IV BOLUS
INTRAVENOUS | Status: AC
  Filled 2015-10-16: qty 20

## 2015-10-16 MED ORDER — GLYCOPYRROLATE 0.2 MG/ML IJ SOLN
INTRAMUSCULAR | Status: DC | PRN
  Administered 2015-10-16: .2 mg via INTRAVENOUS

## 2015-10-16 MED ORDER — HYDROCODONE-ACETAMINOPHEN 5-325 MG PO TABS: 1.0000 | ORAL_TABLET | Freq: Four times a day (QID) | ORAL | Status: DC | PRN

## 2015-10-16 MED ORDER — EPHEDRINE SULFATE 50 MG/ML IJ SOLN
INTRAMUSCULAR | Status: AC
  Filled 2015-10-16: qty 1

## 2015-10-16 MED ORDER — FENTANYL CITRATE (PF) 250 MCG/5ML IJ SOLN
INTRAMUSCULAR | Status: AC
  Filled 2015-10-16: qty 5

## 2015-10-16 MED ORDER — ROCURONIUM BROMIDE 50 MG/5ML IV SOLN
INTRAVENOUS | Status: AC
  Filled 2015-10-16: qty 1

## 2015-10-16 MED ORDER — RIVAROXABAN 20 MG PO TABS
20.0000 mg | ORAL_TABLET | Freq: Every day | ORAL | Status: DC
  Administered 2015-10-17: 20 mg via ORAL
  Filled 2015-10-16: qty 1

## 2015-10-16 MED ORDER — METOCLOPRAMIDE HCL 5 MG/ML IJ SOLN: 5.0000 mg | Freq: Three times a day (TID) | INTRAMUSCULAR | Status: DC | PRN

## 2015-10-16 MED ORDER — CLINDAMYCIN PHOSPHATE 600 MG/50ML IV SOLN
600.0000 mg | INTRAVENOUS | Status: AC
  Filled 2015-10-16: qty 50

## 2015-10-16 MED ORDER — ROCURONIUM BROMIDE 100 MG/10ML IV SOLN
INTRAVENOUS | Status: DC | PRN
  Administered 2015-10-16: 30 mg via INTRAVENOUS

## 2015-10-16 MED ORDER — ONDANSETRON HCL 4 MG/2ML IJ SOLN: 4.0000 mg | Freq: Four times a day (QID) | INTRAMUSCULAR | Status: DC | PRN

## 2015-10-16 MED ORDER — METHOCARBAMOL 1000 MG/10ML IJ SOLN
500.0000 mg | Freq: Four times a day (QID) | INTRAVENOUS | Status: DC | PRN
  Filled 2015-10-16 (×2): qty 5

## 2015-10-16 MED ORDER — SUCCINYLCHOLINE CHLORIDE 20 MG/ML IJ SOLN
INTRAMUSCULAR | Status: AC
  Filled 2015-10-16: qty 1

## 2015-10-16 MED ORDER — ONDANSETRON HCL 4 MG/2ML IJ SOLN
INTRAMUSCULAR | Status: AC
  Filled 2015-10-16: qty 2

## 2015-10-16 MED ORDER — GLYCOPYRROLATE 0.2 MG/ML IJ SOLN
INTRAMUSCULAR | Status: AC
  Filled 2015-10-16: qty 2

## 2015-10-16 MED ORDER — LIDOCAINE HCL (CARDIAC) 20 MG/ML IV SOLN
INTRAVENOUS | Status: DC | PRN
  Administered 2015-10-16: 50 mg via INTRATRACHEAL

## 2015-10-16 MED ORDER — PHENOL 1.4 % MT LIQD: 1.0000 | OROMUCOSAL | Status: DC | PRN

## 2015-10-16 MED ORDER — FENTANYL CITRATE (PF) 250 MCG/5ML IJ SOLN
INTRAMUSCULAR | Status: DC | PRN
  Administered 2015-10-16 (×3): 50 ug via INTRAVENOUS

## 2015-10-16 MED ORDER — CLINDAMYCIN PHOSPHATE 600 MG/50ML IV SOLN
600.0000 mg | Freq: Once | INTRAVENOUS | Status: AC
  Administered 2015-10-16: 600 mg via INTRAVENOUS

## 2015-10-16 MED ORDER — NEOSTIGMINE METHYLSULFATE 10 MG/10ML IV SOLN
INTRAVENOUS | Status: DC | PRN
  Administered 2015-10-16: 2 mg via INTRAVENOUS
  Administered 2015-10-16: .5 mg via INTRAVENOUS

## 2015-10-16 MED ORDER — SODIUM CHLORIDE 0.9 % IR SOLN
Status: DC | PRN
  Administered 2015-10-16: 3000 mL

## 2015-10-16 MED ORDER — PROPOFOL 10 MG/ML IV BOLUS
INTRAVENOUS | Status: DC | PRN
  Administered 2015-10-16: 100 mg via INTRAVENOUS
  Administered 2015-10-16: 30 mg via INTRAVENOUS

## 2015-10-16 SURGICAL SUPPLY — 55 items
BIT DRILL CALIBRATED 4.3MMX365 (DRILL) ×1 IMPLANT
BIT DRILL CROWE PNT TWST 4.5MM (DRILL) ×1 IMPLANT
BNDG COHESIVE 6X5 TAN STRL LF (GAUZE/BANDAGES/DRESSINGS) ×9 IMPLANT
BNDG ELASTIC 6X15 VLCR STRL LF (GAUZE/BANDAGES/DRESSINGS) ×3 IMPLANT
CANISTER SUCTION 2500CC (MISCELLANEOUS) ×3 IMPLANT
COTTON STERILE ROLL (GAUZE/BANDAGES/DRESSINGS) ×3 IMPLANT
COVER SURGICAL LIGHT HANDLE (MISCELLANEOUS) ×3 IMPLANT
DRAPE C-ARM 42X72 X-RAY (DRAPES) ×3 IMPLANT
DRAPE IMP U-DRAPE 54X76 (DRAPES) ×6 IMPLANT
DRAPE ORTHO SPLIT 77X108 STRL (DRAPES) ×4
DRAPE PROXIMA HALF (DRAPES) ×3 IMPLANT
DRAPE SURG ORHT 6 SPLT 77X108 (DRAPES) ×2 IMPLANT
DRAPE U-SHAPE 47X51 STRL (DRAPES) ×6 IMPLANT
DRESSING ALLEVYN LIFE SACRUM (GAUZE/BANDAGES/DRESSINGS) ×3 IMPLANT
DRILL CALIBRATED 4.3MMX365 (DRILL) ×3
DRILL CROWE POINT TWIST 4.5MM (DRILL) ×3
DRSG ADAPTIC 3X8 NADH LF (GAUZE/BANDAGES/DRESSINGS) ×3 IMPLANT
DRSG MEPILEX BORDER 4X4 (GAUZE/BANDAGES/DRESSINGS) ×3 IMPLANT
ELECT REM PT RETURN 9FT ADLT (ELECTROSURGICAL) ×3
ELECTRODE REM PT RTRN 9FT ADLT (ELECTROSURGICAL) ×1 IMPLANT
FLUID NSS /IRRIG 3000 ML XXX (IV SOLUTION) ×3 IMPLANT
GAUZE SPONGE 4X4 12PLY STRL (GAUZE/BANDAGES/DRESSINGS) ×3 IMPLANT
GOWN STRL REUS W/ TWL LRG LVL3 (GOWN DISPOSABLE) ×1 IMPLANT
GOWN STRL REUS W/ TWL XL LVL3 (GOWN DISPOSABLE) ×2 IMPLANT
GOWN STRL REUS W/TWL LRG LVL3 (GOWN DISPOSABLE) ×2
GOWN STRL REUS W/TWL XL LVL3 (GOWN DISPOSABLE) ×4
HANDPIECE INTERPULSE COAX TIP (DISPOSABLE) ×2
IMMOBILIZER KNEE 22 (SOFTGOODS) ×3 IMPLANT
KIT BASIN OR (CUSTOM PROCEDURE TRAY) ×3 IMPLANT
KIT ROOM TURNOVER OR (KITS) ×3 IMPLANT
NAIL FEM RETRO 10.5X320 (Nail) ×3 IMPLANT
NS IRRIG 1000ML POUR BTL (IV SOLUTION) ×3 IMPLANT
PACK GENERAL/GYN (CUSTOM PROCEDURE TRAY) ×3 IMPLANT
PACK UNIVERSAL I (CUSTOM PROCEDURE TRAY) ×3 IMPLANT
PAD ABD 8X10 STRL (GAUZE/BANDAGES/DRESSINGS) ×3 IMPLANT
PAD ARMBOARD 7.5X6 YLW CONV (MISCELLANEOUS) ×3 IMPLANT
PADDING CAST COTTON 6X4 STRL (CAST SUPPLIES) ×3 IMPLANT
SCREW CORT TI DBL LEAD 5X30 (Screw) ×3 IMPLANT
SCREW CORT TI DBL LEAD 5X36 (Screw) ×3 IMPLANT
SCREW CORT TI DBL LEAD 5X50 (Screw) ×3 IMPLANT
SCREW CORT TI DBL LEAD 5X60 (Screw) ×6 IMPLANT
SCREW CORT TI DBL LEAD 5X65 (Screw) ×3 IMPLANT
SCREW CORT TI DBLE LEAD 5X52 (Screw) ×3 IMPLANT
SET HNDPC FAN SPRY TIP SCT (DISPOSABLE) ×1 IMPLANT
STAPLER VISISTAT 35W (STAPLE) ×3 IMPLANT
STOCKINETTE IMPERVIOUS LG (DRAPES) ×3 IMPLANT
SUT ETHILON 2 0 FS 18 (SUTURE) ×3 IMPLANT
SUT MON AB 2-0 CT1 36 (SUTURE) ×6 IMPLANT
SUT VIC AB 0 CT1 27 (SUTURE) ×2
SUT VIC AB 0 CT1 27XBRD ANBCTR (SUTURE) ×1 IMPLANT
SUT VIC AB 1 CT1 27 (SUTURE) ×2
SUT VIC AB 1 CT1 27XBRD ANBCTR (SUTURE) ×1 IMPLANT
SUT VLOC 180 0 24IN GS25 (SUTURE) ×3 IMPLANT
TOWEL OR 17X24 6PK STRL BLUE (TOWEL DISPOSABLE) ×3 IMPLANT
TOWEL OR 17X26 10 PK STRL BLUE (TOWEL DISPOSABLE) ×12 IMPLANT

## 2015-10-16 NOTE — Anesthesia Postprocedure Evaluation (Signed)
Anesthesia Post Note  Patient: [name] [name]  Procedure(s) Performed: Procedure(s) (LRB): IRRIGATION AND DEBRIDEMENT LEFT LEG (Left)  Patient location during evaluation: PACU Anesthesia Type: General Level of consciousness: awake Pain management: pain level controlled Vital Signs Assessment: post-procedure vital signs reviewed and stable Respiratory status: spontaneous breathing Cardiovascular status: stable Postop Assessment: no signs of nausea or vomiting Anesthetic complications: no    Last Vitals:  Filed Vitals:   10/16/15 0541 10/16/15 1052  BP: 136/62   Pulse: 63   Temp: 37.1 C 36.4 C  Resp: 16     Last Pain:  Filed Vitals:   10/16/15 1055  PainSc: Asleep                 Ariaunna Longsworth

## 2015-10-16 NOTE — Progress Notes (Signed)
Pt off unit in OR.

## 2015-10-16 NOTE — Anesthesia Procedure Notes (Signed)
Procedure Name: Intubation Date/Time: 10/16/2015 7:53 AM Performed by: Yvonne Kendall S Patient Re-evaluated:Patient Re-evaluated prior to inductionOxygen Delivery Method: Circle system utilized Preoxygenation: Pre-oxygenation with 100% oxygen Intubation Type: IV induction Ventilation: Mask ventilation without difficulty Laryngoscope Size: Glidescope Grade View: Grade III Tube type: Oral Tube size: 7.0 mm Number of attempts: 2 Airway Equipment and Method: Video-laryngoscopy Placement Confirmation: ETT inserted through vocal cords under direct vision,  positive ETCO2 and breath sounds checked- equal and bilateral Tube secured with: Tape Dental Injury: Teeth and Oropharynx as per pre-operative assessment  Difficulty Due To: Difficult Airway- due to dentition and Difficult Airway- due to anterior larynx

## 2015-10-16 NOTE — Clinical Social Work Note (Signed)
CSW spoke with liaison at [facility] who reports SNF is able to make a bed offer to patient.  Patient is scheduled for surgery today.  SNF made aware and agreeable.  Disposition: SNF- [facility]   Vickii Penna, LCSW [phone]  5N1-9; 2S 15-16 and [facility] Licensed Clinical Social Worker

## 2015-10-16 NOTE — Brief Op Note (Signed)
10/14/2015 - 10/16/2015  10:36 AM  PATIENT:  [name] [name]  82 y.o. female  PRE-OPERATIVE DIAGNOSIS:  left periprosthetic femur fracture  POST-OPERATIVE DIAGNOSIS:  left periprosthetic femur fracture  PROCEDURE:  Procedure(s): OPEN REDUCTION INTERNAL FIXATION (ORIF) DISTAL FEMUR FRACTURE (Left)  SURGEON:  Surgeon(s) and Role:    * Samson Frederic, MD - Primary  PHYSICIAN ASSISTANT: none  ASSISTANTS: staff   ANESTHESIA:   general  EBL:  Total I/O In: 1000 [I.V.:1000] Out: 100 [Urine:100]  BLOOD ADMINISTERED:none  DRAINS: none   LOCAL MEDICATIONS USED:  NONE  SPECIMEN:  No Specimen  DISPOSITION OF SPECIMEN:  N/A  COUNTS:  YES  TOURNIQUET:  * No tourniquets in log *  DICTATION: .Other Dictation: Dictation Number [id number]  PLAN OF CARE: Admit to inpatient   PATIENT DISPOSITION:  PACU - hemodynamically stable.   Delay start of Pharmacological VTE agent (>24hrs) due to surgical blood loss or risk of bleeding: no

## 2015-10-16 NOTE — OR Nursing (Signed)
1045:  allevyn sacral dressing protocol begun post-op; sacral area skin supple and intact at this time.

## 2015-10-16 NOTE — Progress Notes (Signed)
OT Cancellation Note  Patient Details Name: [name] [name] MRN: [id number] DOB: 11/16/1921   Cancelled Treatment:    Reason Eval/Treat Not Completed: Patient not medically ready. Per ortho note from yesterday (10/15/2015) pt for sx again today for I&D as well as on bedrest. Will await increased activity orders in order to eval pt.  Evette Georges [phone] 10/16/2015, 7:49 AM

## 2015-10-16 NOTE — H&P (Signed)
[name] Rossetti, MD Chief Complaint: Left knee fracture - level 2 trauma activation History: HPI: Brought in by EMS, [name] [name] is a 82 y.o. female with history of af-fib on Xarelto, HTN, COPD, DM, s/p bilateral knee replacement by [name] 5-7 years ago who presents to the Emergency Department complaining of a witnessed fall tonight at home. Per EMS, the pt tripped and fell forwards. The patient's daughter states that she was looking out of the window when she turned around and "just fell". Daughter does not believe that the pt struck her head. Associated symptoms include obvious left knee deformity. 200 mcg of fentanyl given by EMS PTA. Tetanus status unknown. Pt has an allergy to penicillin, daughter states that she is not sure of the nature of the reaction.   HR is 50s. Daughter reports this is patient's baseline. Patient denies chest pain or shortness of breath.  Past Medical History  Diagnosis Date  . Hypertension   . Diabetes mellitus without complication (HCC)   . COPD (chronic obstructive pulmonary disease) (HCC)   . Atrial fibrillation (HCC)   . Glaucoma     Bilaterally  . CHF (congestive heart failure) (HCC)   . HLD (hyperlipidemia)   . GERD (gastroesophageal reflux disease)   . Depression     Allergies  Allergen Reactions  . Penicillins Other (See Comments)    unknown    No current facility-administered medications on file prior to encounter.   No current outpatient prescriptions on file prior to encounter.    Physical Exam: Filed Vitals:   10/14/15 0330 10/14/15 0338  BP:    Pulse: 53 51  Temp:    Resp: 12 17  alert and orientated No SOB/CP Abd soft/NT No pelvic pain - stable to AC/LC 1+ DP/PT pulses Compartments soft/NT Anterior-lateral 1 cm laceration with active bleeding noted left knee Obvious deformity of the knee No deformity/crepitus/or pain with ROM of UE or right LE  Image:   Imaging Results    Ct Head Wo Contrast  10/14/2015 CLINICAL DATA: Witnessed fall at home. Did not hit head. Left lower femoral fracture. EXAM: CT HEAD WITHOUT CONTRAST CT CERVICAL SPINE WITHOUT CONTRAST TECHNIQUE: Multidetector CT imaging of the head and cervical spine was performed following the standard protocol without intravenous contrast. Multiplanar CT image reconstructions of the cervical spine were also generated. COMPARISON: None. FINDINGS: CT HEAD FINDINGS Diffuse cerebral atrophy. Ventricular dilatation consistent with central atrophy. Low-attenuation changes in the deep white matter consistent with small vessel ischemia. No mass effect or midline shift. No abnormal extra-axial fluid collections. Gray-white matter junctions are distinct. Basal cisterns are not effaced. No evidence of acute intracranial hemorrhage. No depressed skull fractures. Partial opacification of some of the mastoid air cells bilaterally. Visualized paranasal sinuses are not opacified. Vascular calcifications. CT CERVICAL SPINE FINDINGS Diffuse bone demineralization. Normal alignment of the cervical spine. Degenerative changes throughout with narrowed cervical interspaces and associated endplate hypertrophic changes. Degenerative changes in the facet joints. No vertebral compression deformities. No prevertebral soft tissue swelling. Benign-appearing cystic lesion in the right lateral mass of C5. C1-2 articulation appears intact. Interstitial infiltrates in the lung apices may indicate edema. Soft tissues are unremarkable. Vascular calcifications. IMPRESSION: No acute intracranial abnormalities. Chronic atrophy and small vessel ischemic changes. Normal alignment of the cervical spine. Diffuse degenerative changes. No acute bony abnormalities. Electronically Signed By: Burman Nieves M.D. On: 10/14/2015 02:06   Ct Cervical Spine Wo Contrast  10/14/2015 CLINICAL DATA: Witnessed fall at home. Did not hit head. Left  lower  femoral fracture. EXAM: CT HEAD WITHOUT CONTRAST CT CERVICAL SPINE WITHOUT CONTRAST TECHNIQUE: Multidetector CT imaging of the head and cervical spine was performed following the standard protocol without intravenous contrast. Multiplanar CT image reconstructions of the cervical spine were also generated. COMPARISON: None. FINDINGS: CT HEAD FINDINGS Diffuse cerebral atrophy. Ventricular dilatation consistent with central atrophy. Low-attenuation changes in the deep white matter consistent with small vessel ischemia. No mass effect or midline shift. No abnormal extra-axial fluid collections. Gray-white matter junctions are distinct. Basal cisterns are not effaced. No evidence of acute intracranial hemorrhage. No depressed skull fractures. Partial opacification of some of the mastoid air cells bilaterally. Visualized paranasal sinuses are not opacified. Vascular calcifications. CT CERVICAL SPINE FINDINGS Diffuse bone demineralization. Normal alignment of the cervical spine. Degenerative changes throughout with narrowed cervical interspaces and associated endplate hypertrophic changes. Degenerative changes in the facet joints. No vertebral compression deformities. No prevertebral soft tissue swelling. Benign-appearing cystic lesion in the right lateral mass of C5. C1-2 articulation appears intact. Interstitial infiltrates in the lung apices may indicate edema. Soft tissues are unremarkable. Vascular calcifications. IMPRESSION: No acute intracranial abnormalities. Chronic atrophy and small vessel ischemic changes. Normal alignment of the cervical spine. Diffuse degenerative changes. No acute bony abnormalities. Electronically Signed By: Burman Nieves M.D. On: 10/14/2015 02:06   Dg Pelvis Portable  10/14/2015 CLINICAL DATA: Status post fall, with concern for pelvic injury. Initial encounter. EXAM: PORTABLE PELVIS 1-2 VIEWS COMPARISON: None. FINDINGS: There is no evidence of fracture or dislocation. Both  femoral heads are seated normally within their respective acetabula. The patient is status post vertebroplasty at the lower lumbar spine. Mild surrounding degenerative change is noted. The sacroiliac joints are unremarkable in appearance. The visualized bowel gas pattern is grossly unremarkable in appearance. Scattered vascular calcifications are seen. IMPRESSION: No evidence of fracture or dislocation. Electronically Signed By: Roanna Raider M.D. On: 10/14/2015 01:18   Ct Femur Left Wo Contrast  10/14/2015 CLINICAL DATA: Witnessed fall at home. Left lower femoral fracture on plain radiographs. EXAM: CT OF THE LEFT FEMUR WITHOUT CONTRAST TECHNIQUE: Multidetector CT imaging was performed according to the standard protocol. Multiplanar CT image reconstructions were also generated. COMPARISON: Left knee 10/14/2015 FINDINGS: Left total knee arthroplasty. Acute comminuted fractures of the distal left femoral metaphysis. Fracture lines extend to the superior portion of the femoral prosthesis. There is anterior displacement and angulation of the distal fracture fragments with multiple displaced and angulated butterfly fragments. We bone femoral head fragment is rotated anteriorly. Multiple displaced cortical fragments are present with overriding and impaction of fracture fragments. Scattered gas collections are demonstrated in about the fracture fragments and infiltrating into the subcutaneous fat into the adjacent muscle compartments anteriorly and posteriorly. Gas within the fracture is consistent with open fracture. A displaced bone fragment is demonstrated along the superolateral aspect of the left lower leg at the level of the knee with extension to the skin surface consistent with a penetrating fragment. Small left knee effusion. Soft tissue infiltration in the subcutaneous fat and muscles around the left kidney consistent with edema or hematomas. Prominent vascular calcifications. No dislocation of the  left hip. Visualization of the left knee is limited due to streak artifact arising from the femoral prosthesis. IMPRESSION: Acute comminuted fractures of the distal left femoral metaphysis extending to the base of the femoral component of the knee arthroplasty. Diffuse soft tissue gas is present with a displaced bone fragment demonstrated just beneath the skin surface consistent with an open fracture. Electronically Signed By:  Burman Nieves M.D. On: 10/14/2015 02:12   Dg Chest Portable 1 View  10/14/2015 CLINICAL DATA: Status post fall, with concern for chest injury. Initial encounter. EXAM: PORTABLE CHEST 1 VIEW COMPARISON: None. FINDINGS: The lungs are well-aerated. Mild peribronchial thickening is noted. There is no evidence of focal opacification, pleural effusion or pneumothorax. The cardiomediastinal silhouette is mildly enlarged. No acute osseous abnormalities are seen. IMPRESSION: Mild peribronchial thickening noted. Mild cardiomegaly. Lungs otherwise grossly clear. No displaced rib fracture seen. Electronically Signed By: Roanna Raider M.D. On: 10/14/2015 01:17   Dg Knee Left Port  10/14/2015 CLINICAL DATA: Trauma. Fall. Open left knee injury. EXAM: PORTABLE LEFT KNEE - 1-2 VIEW COMPARISON: None. FINDINGS: Left total knee arthroplasty with patellar femoral component. There is an acute comminuted and displaced fracture of the distal left femoral metaphysis with fracture lines extending to the base of the femoral component of the arthroplasty. There is anterior angulation of the major distal fracture fragments with anterior and medial displacement of fracture fragments. Medial overriding of fracture fragments. Several displaced butterfly fragments are present. Calcifications in the suprapatellar space appear to be well corticated and may be old loose bodies. Small effusion. Proximal tibia and fibula appear intact. IMPRESSION: Acute comminuted and displaced fractures of the distal  left femoral metaphysis extending to the base of the to femoral component of the left knee arthroplasty. Electronically Signed By: Burman Nieves M.D. On: 10/14/2015 01:19   Dg Femur Port Min 2 Views Left  10/14/2015 CLINICAL DATA: Trauma. [employer]. Left open knee injury. EXAM: LEFT FEMUR PORTABLE 2 VIEWS COMPARISON: None. FINDINGS: Acute comminuted and displaced fractures of the distal left radial metaphysis extending to the base of the femoral component of a left knee arthroplasty. See additional report of left knee same date. Limited view of the proximal left femur appears intact. No gross evidence of dislocation of the left hip. Vascular calcifications. IMPRESSION: Acute comminuted and displaced fractures of the distal left radial metaphysis. See additional report of left knee. Proximal left femur is grossly intact. Electronically Signed By: Burman Nieves M.D. On: 10/14/2015 01:23     A/P:  82 yr old female s/p fall from standing height with significantly comminuted left distal femur fracture (periprosthetic) Open injury with active bleeding noted. Plan on formal I&D of the knee. Will apply splint for stabilization. Will review case with Dr Charlann Boxer in AM to discuss definitive fracture management Risks reviewed with patient and her daughter - infection, bleeding, nerve damage, death, stroke, paralysis, need for further surgery. Consent obtained.

## 2015-10-16 NOTE — Progress Notes (Signed)
ANTIBIOTIC CONSULT NOTE - INITIAL  Pharmacy Consult for Vancomycin Indication: surgical prophylaxis  Allergies   PCN  Patient Measurements: Height:  (160 cm) Weight: 148 lb (67.132 kg) IBW/kg (Calculated) : 52.4  Labs:  Recent Labs  10/14/15 0535 10/15/15 0500 10/15/15 1819 10/16/15 0528  WBC 11.4* 7.9  --  8.0  HGB 10.9* 8.9* 9.4* 9.8*  PLT 273 268  --  223  CREATININE 0.73 0.71  --  0.69   Estimated Creatinine Clearance: 42.2 mL/min (by C-G formula based on Cr of 0.69).   Microbiology: Recent Results (from the past 720 hour(s))  Surgical pcr screen     Status: None   Collection Time: 10/15/15 10:20 PM  Result Value Ref Range Status   MRSA, PCR NEGATIVE NEGATIVE Final   Staphylococcus aureus NEGATIVE NEGATIVE Final    Comment:        The Xpert SA Assay (FDA approved for NASAL specimens in patients over 84 years of age), is one component of a comprehensive surveillance program.  Test performance has been validated by [facility] for patients greater than or equal to 86 year old. It is not intended to diagnose infection nor to guide or monitor treatment.     Medical History: Past Medical History  Diagnosis Date  . Hypertension   . Diabetes mellitus without complication (HCC)   . COPD (chronic obstructive pulmonary disease) (HCC)   . Atrial fibrillation (HCC)   . Glaucoma     Bilaterally  . CHF (congestive heart failure) (HCC)   . HLD (hyperlipidemia)   . GERD (gastroesophageal reflux disease)   . Depression    Assessment:   82 yr old female for 24hrs of Vancomycin post-op I&D and IM fixation of left periprosthetic distal femur fracture.   Received Vancomycin 1 gram IV and Clindamycin 600 mg IV pre-op at ~8am     Goal of Therapy:  Vancomycin trough level 10-15 mcg/ml  Plan:    Vancomycin 750 mg IV q24hrs x 1 dose at 6am on 10/16/14.   Modified from Vanc 1 gm IV q12hrs x 2 doses.  Dennie Fetters, RPh Pager: [phone] 10/16/2015,2:12  PM

## 2015-10-16 NOTE — Progress Notes (Addendum)
[facility] Progress Note   [name] [name]  ZOX:[id number]  DOB: 05/14/1922  DOA: 10/14/2015 PCP: [name], MD  Brief narrative: [name] [name] is a 82 y.o. female PMH of af-fib on Xarelto, HTN, COPD, DM, s/p bilateral knee replacement by [name] 5-7 years ago, GERD, depression, congestive heart failure, who presents with left knee pain after fall. She is found to have a periprothetic knee fracture   Subjective: Still sedated from surgery. Family at bedside asking that we not give her anything more than Tylenol and Robaxin for pain as narcotics make her confused.   Assessment/Plan: Open periprosthetic femur fracture, left - management per ortho-    Anemia - due to acute blood loss from knee fracture? receiving blood per ortho - see Hb reading below  HTN - currently normotensive- cont to hold Verapamil  Leukocytosis:  Likely due to stress-induced demargination. Patient does not have signs of infection and WBC count has normalized  Atrial Fibrillation:  CHA2DS2-VASc Score is 6- Patient is on Xarelto at home.  - resume Xarelto after surgery per ortho  DM-II:  - Last A1c not on record. Patient is taking metformin and Januvia at home -SSI while here - A1c is 8.3  COPD:  - stable. -Continue Xopenex inhaler, Spiriva inhaler  CHF (congestive heart failure) (HCC):  - No 2-D echo on record, not sure which type of CHF. Patient is on Lasix. No leg edema. CHF is compensated.  -Hold her Lasix for now as she is compensated  HLD: - Last LDL was not on record- current LDL is 121 -Continue home medications: Pravastatin  GERD: -Protonix  Depression:  -Stable, no suicidal or homicidal ideations. -Continue home medications: Celexa      Antibiotics: Anti-infectives    Start     Dose/Rate Route Frequency Ordered Stop   10/16/15 0845  clindamycin (CLEOCIN) IVPB 600 mg     600 mg 100 mL/hr over 30 Minutes Intravenous  Once 10/16/15 8119 10/16/15 0756   10/15/15  0815  vancomycin (VANCOCIN) IVPB 1000 mg/200 mL premix     1,000 mg 200 mL/hr over 60 Minutes Intravenous To [facility] 10/15/15 0800 10/16/15 0816   10/14/15 1500  vancomycin (VANCOCIN) IVPB 1000 mg/200 mL premix     1,000 mg 200 mL/hr over 60 Minutes Intravenous Every 12 hours 10/14/15 0603 10/14/15 1811   10/14/15 0315  vancomycin (VANCOCIN) IVPB 1000 mg/200 mL premix     1,000 mg 200 mL/hr over 60 Minutes Intravenous  Once 10/14/15 0305 10/14/15 0416     Code Status:     Code Status Orders        Start     Ordered   10/14/15 0524  Do not attempt resuscitation (DNR)   Continuous    Question Answer Comment  In the event of cardiac or respiratory ARREST Do not call a "code blue"   In the event of cardiac or respiratory ARREST Do not perform Intubation, CPR, defibrillation or ACLS   In the event of cardiac or respiratory ARREST Use medication by any route, position, wound care, and other measures to relive pain and suffering. May use oxygen, suction and manual treatment of airway obstruction as needed for comfort.      10/14/15 0523    Code Status History    Date Active Date Inactive Code Status Order ID Comments User Context   10/14/2015  3:41 AM 10/14/2015  5:23 AM DNR [id number]  Lorretta Harp, MD ED    Advance Directive Documentation  Most Recent Value   Type of Advance Directive  Healthcare Power of Attorney, Living will   Pre-existing out of facility DNR order (yellow form or pink MOST form)     "MOST" Form in Place?       Family Communication: daughter at bedside Disposition Plan: will need SNF  DVT prophylaxis: Lovenox Consultants: ortho Procedures:     Objective: Filed Weights   10/14/15 0056  Weight: 67.132 kg (148 lb)    Intake/Output Summary (Last 24 hours) at 10/16/15 1238 Last data filed at 10/16/15 1209  Gross per 24 hour  Intake   1600 ml  Output   2351 ml  Net   -751 ml     Vitals Filed Vitals:   10/16/15 1211 10/16/15 1215  10/16/15 1219 10/16/15 1221  BP: 93/56     Pulse: 92 83 88   Temp:    97.3 F (36.3 C)  TempSrc:      Resp: Height:      Weight:      SpO2: 95% 97% 96%     Exam:  General:  Pt is alert, not in acute distress  HEENT: No icterus, No thrush, oral mucosa moist  Cardiovascular: regular rate and rhythm, S1/S2 No murmur  Respiratory: clear to auscultation bilaterally   Abdomen: Soft, +Bowel sounds, non tender, non distended, no guarding  MSK: No cyanosis or clubbing- no pedal edema- left leg in splint    Data Reviewed: Basic Metabolic Panel:  Recent Labs Lab 10/14/15 0052 10/14/15 0058 10/14/15 0535 10/15/15 0500 10/16/15 0528  NA 135 136 135 135 137  K 4.4 4.3 4.2 3.9 3.7  CL 101 101 99* 96* 104  CO2 22  --  GLUCOSE 252* 249* 296* 201* 218*  BUN CREATININE 0.62 0.60 0.73 0.71 0.69  CALCIUM 8.9  --  8.4* 8.0* 8.5*   Liver Function Tests:  Recent Labs Lab 10/14/15 0052 10/14/15 0535  AST 17 18  ALT 11* 12*  ALKPHOS 66 54  BILITOT 0.4 0.7  PROT 6.5 5.7*  ALBUMIN 3.5 3.0*   No results for input(s): LIPASE, AMYLASE in the last 168 hours. No results for input(s): AMMONIA in the last 168 hours. CBC:  Recent Labs Lab 10/14/15 0052 10/14/15 0058 10/14/15 0535 10/15/15 0500 10/15/15 1819 10/16/15 0528  WBC 10.9*  --  11.4* 7.9  --  8.0  HGB 12.3 14.3 10.9* 8.9* 9.4* 9.8*  HCT 40.1 42.0 36.1 29.4* 31.1* 32.1*  MCV 76.8*  --  77.5* 76.8*  --  78.3  PLT 354  --  273 268  --  223   Cardiac Enzymes: No results for input(s): CKTOTAL, CKMB, CKMBINDEX, TROPONINI in the last 168 hours. BNP (last 3 results)  Recent Labs  10/14/15 0535  BNP 141.5*    ProBNP (last 3 results) No results for input(s): PROBNP in the last 8760 hours.  CBG:  Recent Labs Lab 10/15/15 0755 10/15/15 1114 10/15/15 1607 10/15/15 2111 10/16/15 1053  GLUCAP 207* 192* 187* 162* 245*    Recent Results (from the past 240 hour(s))  Surgical  pcr screen     Status: None   Collection Time: 10/15/15 10:20 PM  Result Value Ref Range Status   MRSA, PCR NEGATIVE NEGATIVE Final   Staphylococcus aureus NEGATIVE NEGATIVE Final    Comment:        The Xpert SA Assay (FDA approved for NASAL specimens in  patients over 43 years of age), is one component of a comprehensive surveillance program.  Test performance has been validated by [facility] for patients greater than or equal to 46 year old. It is not intended to diagnose infection nor to guide or monitor treatment.      Studies: Dg Chest Port 1 View  10/15/2015  CLINICAL DATA:  Chest pain and hypoxemia. Atrial fibrillation. Patient fell from standing position yesterday. EXAM: PORTABLE CHEST 1 VIEW COMPARISON:  10/14/2015 FINDINGS: Mild cardiomegaly and ectasia of the thoracic aorta are stable. No pneumothorax or pleural effusion visualized. Mild linear opacity in left lung base may be due to atelectasis or scarring. No evidence of pulmonary consolidation or edema. Old left medial clavicle fracture deformity noted. IMPRESSION: Mild cardiomegaly and left basilar atelectasis versus scarring. No evidence of pneumothorax or hemothorax. Electronically Signed   By: Myles Rosenthal M.D.   On: 10/15/2015 12:03   Dg C-arm Gt 120 Min  10/16/2015  CLINICAL DATA:  Status post ORIF for distal left femoral fracture. 3 minutes, 44 seconds fluoro time reported EXAM: DG C-ARM GT 120 MIN none CONTRAST:  None FLUOROSCOPY TIME:  Fluoroscopy Time (in minutes and seconds): 3 minutes, 44 second Number of Acquired Images:  6 COMPARISON:  None. FINDINGS: Six fluoro spot images are reviewed. There is intramedullary nailing of a distal femoral metadiaphyseal fracture. Alignment of the fracture is now near anatomic. The prosthetic knee joint appears appropriate position. IMPRESSION: Intraoperative fluoro spot images revealing ORIF of the distal left femoral meta diaphyseal fracture without evidence of immediate  postprocedure complication. Electronically Signed   By: David  [place] M.D.   On: 10/16/2015 10:27   Dg Femur Port Min 2 Views Left  10/16/2015  CLINICAL DATA:  Distal LEFT femur fracture post ORIF EXAM: LEFT FEMUR PORTABLE 2 VIEWS COMPARISON:  Portable exam 0959 hours compared to 10/14/2015 FINDINGS: Osseous demineralization. IM nail with proximal and distal locking screws identified across day comminuted distal LEFT femoral metadiaphyseal fracture. Fracture fragments are minimally displaced. IM nail extends to near the previously identified LEFT knee prosthesis. No new focal bony abnormalities identified. IMPRESSION: Post nailing of a minimally displaced distal LEFT femoral meta diaphyseal fracture. Osseous demineralization and LEFT knee prosthesis again seen. Electronically Signed   By: Ulyses Southward M.D.   On: 10/16/2015 10:27    Scheduled Meds:  Scheduled Meds: . sodium chloride   Intravenous Once  . citalopram  10 mg Oral Daily  . dorzolamide-timolol  1 drop Both Eyes BID  . enoxaparin (LOVENOX) injection  40 mg Subcutaneous Once  . ferrous sulfate  325 mg Oral TID PC  . insulin aspart  0-15 Units Subcutaneous TID WC  . insulin aspart  0-5 Units Subcutaneous QHS  . pantoprazole  40 mg Oral Daily  . pravastatin  40 mg Oral q1800  . senna-docusate  2 tablet Oral QHS  . tiotropium  18 mcg Inhalation Daily   Continuous Infusions: . lactated ringers Stopped (10/15/15 0018)    Time spent on care of this patient: 35 min   Armonii Sieh, MD 10/16/2015, 12:38 PM  LOS: 2 days   [facility] Office  [phone] Pager - Text Page per [email or web address] If 7PM-7AM, please contact night-coverage [email or web address]

## 2015-10-16 NOTE — Op Note (Signed)
NAMEMALON, SIDDALL NO.:  [id number]  MEDICAL RECORD NO.:  [id number]  LOCATION:  5N07C                        FACILITY:  [facility]  PHYSICIAN:  Samson Frederic, MD     DATE OF BIRTH:  1921-12-13  DATE OF PROCEDURE:  10/16/2015 DATE OF DISCHARGE:                              OPERATIVE REPORT   SURGEON:  Samson Frederic, MD  ASSISTANT:  Staff.  PREOPERATIVE DIAGNOSIS:  Left grade 1 open periprosthetic distal femur fracture.  POSTOPERATIVE DIAGNOSIS:  Left grade 1 open periprosthetic distal femur fracture.  PROCEDURE PERFORMED: 1. Debridement of left lower extremity wound including skin,     subcutaneous tissue, muscle, and bone. 2. Intramedullary fixation of left periprosthetic distal femur     fracture.  ANTIBIOTICS: 1. 1 g vancomycin. 2. 900 mg clindamycin.  EBL:  100 mL.  COMPLICATIONS:  None.  TUBES AND DRAINS:  None.  DISPOSITION:  Stable to PACU.  IMPLANTS:  [employer] [place] nail 10.5 x 340 mm with distal interlocking screws x4 and proximal interlocking screw x1.  INDICATIONS:  The patient is a 82 year old female with multiple medical problems who takes chronic anticoagulation.  She had a ground level fall at home on October 13, 2014, and sustained an open left periprosthetic femur fracture.  She came to the emergency department and was evaluated. Dr. Shon Baton, my partner was on-call that night, took her for a debridement as well as a close reduction and splinting.  He then asked me to assume care.  Risks, benefits, alternatives to the above-mentioned procedures were explained, she elected to proceed.  DESCRIPTION OF PROCEDURE IN DETAIL:  Patient was identified in the holding area using 2 identifiers.  Surgical site was marked by myself. She was taken to the operating room.  General anesthesia was induced on her bed.  She was then transferred to the operating room table.  I removed her splint.  Examination of lower extremity revealed a 1  cm incision over the anterolateral aspect of the knee.  The left lower extremity was prepped and draped in normal sterile surgical fashion. Time-out was called verifying site and site of surgery.  I removed her sutures.  I performed a methodical debridement of skin, subcutaneous tissue, muscle, and a bone fragment.  This was an excisional debridement with a 15 blade as well as a rongeur.  I did debride a bony fragment from within the wound bed which was removed.  I copiously irrigated the wound with saline.  I then turned my attention to fixation of her fracture.  I used a 10 blade to excise her previous total knee scar.  I created full-thickness flaps.  I went down to the extensor mechanism.  I made a standard medial parapatellar arthrotomy.  I performed a minimal medial release.  I brought in a triangle and I reduced the fracture with a traction.  I used an osteotome to remove the plug out of the box of her femoral implant.  I used a guide pin under AP and lateral fluoroscopic control to establish the starting point for retrograde nail.  I used an entry reamer.  I then passed a guidewire proximal to the lesser trochanter.  I measured the length of the nail.  I sequentially reamed up to a 12 mm reamer.  I selected the real nail, which was impacted into place.  Please note, the fracture was extremely comminuted.  I did get a good length and rotation.  I locked distally with 2 transverse and 2 oblique screws.  I tightened the locking sleeve on distally.  I obtained AP and lateral fluoroscopy views to confirm acceptable reduction and hardware placement.  I then turned my attention proximally.  Using perfect circle technique, I placed 1 proximal interlocking screw that had excellent bite.  All wounds were copiously irrigated with saline.  I closed the traumatic wound with interrupted 2- 0 nylon suture.  The remainder of the wounds were closed with #1 Vicryl and 0 V-Loc for the arthrotomy,  2-0 Vicryl for the deep fat, 2-0 Monocryl for the deep dermal layer, skin staples for skin.  Sterile bulky dressing was applied followed by knee immobilizer.  Patient was awakened from anesthesia, extubated, taken to the PACU in stable condition.  Sponge, needle, and instrument counts were correct at the end of the case x2.  There were no known complications.  I discussed the operative events and findings with the patient's family. She will be nonweightbearing left lower extremity.  Bed-to-chair transfers.  We will resume her chronic anticoagulation tomorrow in the form of Xarelto.  She will work with physical and occupational therapy. She will have disposition planning.  I will see her back in the office 2 weeks after discharge from the hospital.  All questions solicited and answered.          ______________________________ Samson Frederic, MD     BS/MEDQ  D:  10/16/2015  T:  10/16/2015  Job:  [id number]

## 2015-10-16 NOTE — Anesthesia Preprocedure Evaluation (Signed)
Anesthesia Evaluation  Patient identified by MRN, date of birth, ID band Patient awake    Reviewed: Allergy & Precautions, H&P , NPO status , Patient's Chart, lab work & pertinent test results  Airway Mallampati: II  TM Distance: >3 FB Neck ROM: full    Dental  (+) Caps, Dental Advisory Given Upper front all capes:   Pulmonary COPD,  COPD inhaler,    Pulmonary exam normal breath sounds clear to auscultation       Cardiovascular Exercise Tolerance: Good hypertension, On Medications +CHF  Normal cardiovascular exam+ dysrhythmias Atrial Fibrillation  Rhythm:regular Rate:Normal  ECG - SR   Neuro/Psych Depression No Residual Symptoms negative neurological ROS  negative psych ROS   GI/Hepatic negative GI ROS, Neg liver ROS, GERD  Medicated and Controlled,  Endo/Other  diabetes, Well Controlled, Type 2, Insulin Dependent  Renal/GU negative Renal ROS  negative genitourinary   Musculoskeletal   Abdominal   Peds  Hematology negative hematology ROS (+) anemia , hgb 9.8   Anesthesia Other Findings   Reproductive/Obstetrics negative OB ROS                             Anesthesia Physical Anesthesia Plan  ASA: III  Anesthesia Plan: General   Post-op Pain Management:    Induction: Intravenous  Airway Management Planned: Oral ETT  Additional Equipment:   Intra-op Plan:   Post-operative Plan: Extubation in OR  Informed Consent: I have reviewed the patients History and Physical, chart, labs and discussed the procedure including the risks, benefits and alternatives for the proposed anesthesia with the patient or authorized representative who has indicated his/her understanding and acceptance.   Dental Advisory Given  Plan Discussed with: CRNA and Surgeon  Anesthesia Plan Comments:         Anesthesia Quick Evaluation

## 2015-10-16 NOTE — Progress Notes (Signed)
PT Cancellation Note  Patient Details Name: [name] [name] MRN: [id number] DOB: [birth year]   Cancelled Treatment:    Reason Eval/Treat Not Completed: Other (comment) (pt currently in OR and will await eval per orders next date)   [name] 10/16/2015, 7:05 AM [name], PT [phone]

## 2015-10-16 NOTE — Transfer of Care (Signed)
Immediate Anesthesia Transfer of Care Note  Patient: [name] [name]  Procedure(s) Performed: Procedure(s): OPEN REDUCTION INTERNAL FIXATION (ORIF) DISTAL FEMUR FRACTURE (Left)  Patient Location: PACU  Anesthesia Type:General  Level of Consciousness: awake, alert  and oriented  Airway & Oxygen Therapy: Patient Spontanous Breathing, Patient connected to nasal cannula oxygen and Patient connected to face mask oxygen  Post-op Assessment: Report given to RN, Post -op Vital signs reviewed and stable and Patient moving all extremities X 4  Post vital signs: Reviewed and stable  Last Vitals:  Filed Vitals:   10/16/15 1101 10/16/15 1102  BP:    Pulse: 72 74  Temp:    Resp: 16 17    Complications: No apparent anesthesia complications

## 2015-10-16 NOTE — Anesthesia Postprocedure Evaluation (Signed)
Anesthesia Post Note  Patient: [name] [name]  Procedure(s) Performed: Procedure(s) (LRB): OPEN REDUCTION INTERNAL FIXATION (ORIF) DISTAL FEMUR FRACTURE (Left)  Patient location during evaluation: PACU Anesthesia Type: General Level of consciousness: awake and alert Pain management: pain level controlled Vital Signs Assessment: post-procedure vital signs reviewed and stable Respiratory status: spontaneous breathing, nonlabored ventilation, respiratory function stable and patient connected to nasal cannula oxygen Cardiovascular status: blood pressure returned to baseline and stable Postop Assessment: no signs of nausea or vomiting Anesthetic complications: no    Last Vitals:  Filed Vitals:   10/16/15 1219 10/16/15 1221  BP:    Pulse: 88   Temp:  36.3 C  Resp: 19     Last Pain:  Filed Vitals:   10/16/15 1222  PainSc: 0-No pain                 Keylah Darwish L

## 2015-10-16 NOTE — Progress Notes (Addendum)
Family inquiring to know if patient is to be placed on telemetry monitoring due to history of afib and requesting to know is patient can have pain medication. Patient is currently sleeping and continues to be lethargic. Advised family that nurse does not feel comfortable to giving pain medication at this time due to patient currently sleeping soundly and this nurse does not feel safe in administering more pain meds at this time. Family verbalized understanding. Dr. Butler Denmark contacted and made aware of above. Md in agreement that there is no need for telemetry monitoring at this point. Will continue to monitor patient.

## 2015-10-16 NOTE — Interval H&P Note (Signed)
History and Physical Interval Note:  10/16/2015 7:05 AM  [name] [name]  has presented today for surgery, with the diagnosis of LEFT PERIPROSTHETIC FEMUR FRACTURE   The various methods of treatment have been discussed with the patient and family. After consideration of risks, benefits and other options for treatment, the patient has consented to  Procedure(s): OPEN REDUCTION INTERNAL FIXATION (ORIF) LEFT FEMUR FRACTURE (Left) as a surgical intervention .  The patient's history has been reviewed, patient examined, no change in status, stable for surgery.  I have reviewed the patient's chart and labs.  Questions were answered to the patient's satisfaction.     Tamon Parkerson, [name]

## 2015-10-17 ENCOUNTER — Encounter (HOSPITAL_COMMUNITY): Payer: Self-pay | Admitting: Orthopedic Surgery

## 2015-10-17 ENCOUNTER — Inpatient Hospital Stay (HOSPITAL_COMMUNITY): Payer: Medicare Other

## 2015-10-17 LAB — BASIC METABOLIC PANEL
Anion gap: 7 (ref 5–15)
BUN: 9 mg/dL (ref 6–20)
CHLORIDE: 102 mmol/L (ref 101–111)
CO2: 26 mmol/L (ref 22–32)
Calcium: 8.1 mg/dL — ABNORMAL LOW (ref 8.9–10.3)
Creatinine, Ser: 0.78 mg/dL (ref 0.44–1.00)
GFR calc Af Amer: >60 mL/min (ref >60)
GFR calc non Af Amer: >60 mL/min (ref >60)
Glucose, Bld: 235 mg/dL — ABNORMAL HIGH (ref 65–99)
POTASSIUM: 3.9 mmol/L (ref 3.5–5.1)
SODIUM: 135 mmol/L (ref 135–145)

## 2015-10-17 LAB — CBC
HEMATOCRIT: 27.6 % — AB (ref 36.0–46.0)
HEMOGLOBIN: 8.9 g/dL — AB (ref 12.0–15.0)
MCH: 25.1 pg — AB (ref 26.0–34.0)
MCHC: 32.2 g/dL (ref 30.0–36.0)
MCV: 77.7 fL — ABNORMAL LOW (ref 78.0–100.0)
Platelets: 206 10*3/uL (ref 150–400)
RBC: 3.55 MIL/uL — AB (ref 3.87–5.11)
RDW: 17 % — ABNORMAL HIGH (ref 11.5–15.5)
WBC: 10.2 10*3/uL (ref 4.0–10.5)

## 2015-10-17 LAB — GLUCOSE, CAPILLARY
GLUCOSE-CAPILLARY: 237 mg/dL — AB (ref 65–99)
GLUCOSE-CAPILLARY: 201 mg/dL — AB (ref 65–99)
GLUCOSE-CAPILLARY: 233 mg/dL — AB (ref 65–99)
GLUCOSE-CAPILLARY: 250 mg/dL — AB (ref 65–99)

## 2015-10-17 MED ORDER — SODIUM CHLORIDE 0.9 % IV SOLN
INTRAVENOUS | Status: DC
  Administered 2015-10-17 – 2015-10-18 (×2): via INTRAVENOUS

## 2015-10-17 NOTE — Care Management Important Message (Signed)
Important Message  Patient Details  Name: [name] [name] MRN: [id number] Date of Birth: 11-Jun-1922   Medicare Important Message Given:  Yes    [name] P [name] 10/17/2015, 4:27 PM

## 2015-10-17 NOTE — Evaluation (Signed)
Physical Therapy Evaluation Patient Details Name: [name] [name] MRN: [id number] DOB: 09-Nov-1921 Today's Date: 10/17/2015   History of Present Illness  Pt admitted after fall with L femur fx s/p IM nail . PMHx: Afib, HTN, COPD, DM, s/p bilateral knee replacement , GERD, depression, congestive heart failure  Clinical Impression  Pt lethargic on arrival with moaning and limited vocalization. Pt would state yes and no periodically but not consistently to questions. Dgtr present in room throughout. Pt maintained lethargy even with transfer to EOB and unable to further assess and mobilize pt due to cognition at this time. Pt with decreased cognition, mobility, ROM, transfers and function who will benefit from acute therapy to maximize independence and function in order to decrease burden of care. Dgtr educated for bil LE ROM acutely particularly while pt cognition impaired.     Follow Up Recommendations SNF;Supervision/Assistance - 24 hour    Equipment Recommendations  Wheelchair (measurements PT);Wheelchair cushion (measurements PT)    Recommendations for Other Services       Precautions / Restrictions Precautions Precautions: Fall Required Braces or Orthoses: Knee Immobilizer - Left Knee Immobilizer - Left: On at all times Restrictions LLE Weight Bearing: Non weight bearing      Mobility  Bed Mobility Overal bed mobility: Needs Assistance;+2 for physical assistance Bed Mobility: Supine to Sit;Sit to Supine     Supine to sit: Total assist;+2 for physical assistance Sit to supine: Total assist;+2 for physical assistance   General bed mobility comments: Pt not following commands in supine for movement of any extremity. Performed total assist helicopter pivot to EOB with pad and +2 but pt with significant posterior lean in upright and no increase in cognition with upright with return to supine  Transfers                 General transfer comment: unable at this  time  Ambulation/Gait                Stairs            Wheelchair Mobility    Modified Rankin (Stroke Patients Only)       Balance Overall balance assessment: Needs assistance   Sitting balance-Leahy Scale: Zero                                       Pertinent Vitals/Pain Pain Assessment: Faces Pain Score: 5  Pain Location: pt unable to rate due to lethargy but moan, grimace and guarding with movement of LLE Pain Descriptors / Indicators: Grimacing;Guarding Pain Intervention(s): Limited activity within patient's tolerance;Premedicated before session;Repositioned    Home Living Family/patient expects to be discharged to:: Private residence Living Arrangements: Children Available Help at Discharge: Family;Available PRN/intermittently Type of Home: House Home Access: Stairs to enter   [employer] of Steps: 2 Home Layout: One level Home Equipment: Cane - single point      Prior Function Level of Independence: Independent with assistive device(s)         Comments: uses cane for gait, independent with all ADLs, drove car on their property but not on road     Hand Dominance        Extremity/Trunk Assessment   Upper Extremity Assessment: Defer to OT evaluation           Lower Extremity Assessment: Difficult to assess due to impaired cognition         Communication  Communication: Other (comment) (unable to assess due to lethargy)  Cognition Arousal/Alertness: Lethargic   Overall Cognitive Status: Difficult to assess                      General Comments      Exercises        Assessment/Plan    PT Assessment Patient needs continued PT services  PT Diagnosis Generalized weakness;Acute pain;Altered mental status   PT Problem List Decreased strength;Decreased cognition;Decreased activity tolerance;Decreased balance;Pain;Decreased safety awareness;Decreased mobility  PT Treatment Interventions  Gait training;Functional mobility training;Therapeutic activities;Therapeutic exercise;Balance training;Patient/family education;Cognitive remediation   PT Goals (Current goals can be found in the Care Plan section) Acute Rehab PT Goals Patient Stated Goal: be able to move PT Goal Formulation: With family Time For Goal Achievement: 10/31/15 Potential to Achieve Goals: Fair    Frequency Min 3X/week   Barriers to discharge Decreased caregiver support      Co-evaluation PT/OT/SLP Co-Evaluation/Treatment: Yes Reason for Co-Treatment: Necessary to address cognition/behavior during functional activity;For patient/therapist safety PT goals addressed during session: Mobility/safety with mobility;Balance         End of Session   Activity Tolerance: Patient limited by lethargy Patient left: in bed;with call bell/phone within reach;with family/visitor present           Time: 1027-1040 PT Time Calculation (min) (ACUTE ONLY): 13 min   Charges:   PT Evaluation $PT Eval Moderate Complexity: 1 Procedure     PT G CodesDelorse Lek 10/17/2015, 10:54 AM [name], PT [phone]

## 2015-10-17 NOTE — Evaluation (Signed)
Occupational Therapy Evaluation Patient Details Name: [name] [name] MRN: [id number] DOB: 09/01/1922 Today's Date: 10/17/2015    History of Present Illness Pt admitted after fall with L femur fx s/p IM nail . PMHx: Afib, HTN, COPD, DM, s/p bilateral knee replacement , GERD, depression, congestive heart failure   Clinical Impression   Pt with lethargy limiting OT eval. Pts daughter reports pt was independent with ADLs PTA. Unable to assess current level of participation in ADL activities due to level of arousal. Pt currently total assist +2 for bed mobility to reposition and unable to assist with supporting self in sitting. Recommending SNF for further rehab prior to return home. Pt would benefit from continued skilled OT to address established goals.     Follow Up Recommendations  SNF;Supervision/Assistance - 24 hour    Equipment Recommendations  Other (comment) (TBD)    Recommendations for Other Services       Precautions / Restrictions Precautions Precautions: Fall Required Braces or Orthoses: Knee Immobilizer - Left Knee Immobilizer - Left: On at all times Restrictions Weight Bearing Restrictions: Yes LLE Weight Bearing: Non weight bearing      Mobility Bed Mobility Overal bed mobility: Needs Assistance;+2 for physical assistance Bed Mobility: Supine to Sit;Sit to Supine     Supine to sit: Total assist;+2 for physical assistance Sit to supine: Total assist;+2 for physical assistance   General bed mobility comments: Pt not following commands in supine for movement of any extremity. Performed total assist helicopter pivot to EOB with pad and +2 but pt with significant posterior lean in upright and no increase in cognition with upright with return to supine  Transfers                 General transfer comment: unable to assess at this time    Balance Overall balance assessment: Needs assistance Sitting-balance support: Feet supported;Bilateral upper extremity  supported Sitting balance-Leahy Scale: Zero                                      ADL Overall ADL's : Needs assistance/impaired                                       General ADL Comments: Pt currenlty total assist for all ADLs; limited assessement performed secondary to level of arousal and increased lethargy. Pts daughter reports she was independent with all ADLs PTA.      Vision     Perception     Praxis      Pertinent Vitals/Pain Pain Assessment: Faces Pain Score: 5  Faces Pain Scale: Hurts even more Pain Location: pt unable to rate due to lethargy but moan, grimace and guarding with movement of LLE Pain Descriptors / Indicators: Grimacing;Moaning;Guarding Pain Intervention(s): Limited activity within patient's tolerance;Repositioned;Monitored during session;Premedicated before session     Hand Dominance     Extremity/Trunk Assessment Upper Extremity Assessment Upper Extremity Assessment: Difficult to assess due to impaired cognition   Lower Extremity Assessment Lower Extremity Assessment: Defer to PT evaluation       Communication Communication Communication: Other (comment) (unable to assess due to lethargy)   Cognition Arousal/Alertness: Lethargic   Overall Cognitive Status: Difficult to assess                     General  Comments       Exercises       Shoulder Instructions      Home Living Family/patient expects to be discharged to:: Skilled nursing facility Living Arrangements: Children Available Help at Discharge: Family;Available PRN/intermittently Type of Home: House Home Access: Stairs to enter Entrance Stairs-Number of Steps: 2   Home Layout: One level     Bathroom Shower/Tub: Tub only;Walk-in shower (Pt uses walk in )   Bathroom Toilet: Standard     Home Equipment: Cane - single point          Prior Functioning/Environment Level of Independence: Independent with assistive device(s)         Comments: uses cane for gait, independent with all ADLs, drove car on their property but not on road    OT Diagnosis: Generalized weakness;Acute pain;Altered mental status   OT Problem List: Decreased strength;Decreased activity tolerance;Impaired balance (sitting and/or standing);Decreased cognition;Decreased safety awareness;Decreased knowledge of use of DME or AE;Decreased knowledge of precautions;Impaired UE functional use;Pain   OT Treatment/Interventions: Self-care/ADL training;Energy conservation;DME and/or AE instruction;Therapeutic activities;Patient/family education;Balance training    OT Goals(Current goals can be found in the care plan section) Acute Rehab OT Goals Patient Stated Goal: none stated OT Goal Formulation: Patient unable to participate in goal setting Time For Goal Achievement: 10/31/15 Potential to Achieve Goals: Good ADL Goals Pt Will Perform Grooming: sitting;with min guard assist Pt Will Perform Lower Body Bathing: with min assist;sit to/from stand Pt Will Perform Lower Body Dressing: with min assist;sit to/from stand Pt Will Transfer to Toilet: with min assist;with +2 assist;stand pivot transfer;bedside commode Pt Will Perform Toileting - Clothing Manipulation and hygiene: with min assist;sit to/from stand  OT Frequency: Min 2X/week   Barriers to D/C:            Co-evaluation PT/OT/SLP Co-Evaluation/Treatment: Yes Reason for Co-Treatment: Necessary to address cognition/behavior during functional activity;For patient/therapist safety PT goals addressed during session: Mobility/safety with mobility;Balance OT goals addressed during session: ADL's and self-care      End of Session Equipment Utilized During Treatment: Left knee immobilizer  Activity Tolerance: Patient limited by lethargy;Patient limited by pain Patient left: in bed;with call bell/phone within reach;with family/visitor present;with SCD's reapplied   Time: 1026-1040 OT Time  Calculation (min): 14 min Charges:  OT General Charges $OT Visit: 1 Procedure OT Evaluation $OT Eval Moderate Complexity: 1 Procedure G-Codes:     Gaye Alken M.S., OTR/L Pager: [phone] 10/17/2015, 11:47 AM

## 2015-10-17 NOTE — Progress Notes (Signed)
[facility] Progress Note   [name] [name]  ZOX:[id number]  DOB: 10-19-1921  DOA: 10/14/2015 PCP: [name], MD  Brief narrative: [name] [name] is a 82 y.o. female PMH of af-fib on Xarelto, HTN, COPD, DM, s/p bilateral knee replacement by [name] 5-7 years ago, GERD, depression, congestive heart failure, who presents with left knee pain after fall. She is found to have a periprothetic knee fracture   Subjective: Still sedated from surgery.  Open eyes, mumbles, recognizes daughter and falls back to sleep.   Assessment/Plan: Open periprosthetic femur fracture, left - management per ortho-    Lethargy - vitals- beginning to wake up-vitals stable- no abnormality on labs.   Anemia - due to acute blood loss from knee fracture? receiving blood per ortho - see Hb reading below  HTN - currently normotensive- cont to hold Verapamil  Leukocytosis:  Likely due to stress-induced demargination. Patient does not have signs of infection and WBC count has normalized  Atrial Fibrillation:  CHA2DS2-VASc Score is 6- Patient is on Xarelto at home.  - resume Xarelto after surgery per ortho  DM-II:  - Last A1c not on record. Patient is taking metformin and Januvia at home -SSI while here - A1c is 8.3  COPD:  - stable. -Continue Xopenex inhaler, Spiriva inhaler  CHF (congestive heart failure) (HCC):  - No 2-D echo on record, not sure which type of CHF. Patient is on Lasix. No leg edema. CHF is compensated.  -Hold her Lasix for now as she is compensated  HLD: - Last LDL was not on record- current LDL is 121 -Continue home medications: Pravastatin  GERD: -Protonix  Depression:  -Stable, no suicidal or homicidal ideations. -Continue home medications: Celexa      Antibiotics: Anti-infectives    Start     Dose/Rate Route Frequency Ordered Stop   10/17/15 0600  vancomycin (VANCOCIN) IVPB 750 mg/150 ml premix     750 mg 150 mL/hr over 60 Minutes Intravenous Every 24  hours 10/16/15 1411 10/17/15 0753   10/16/15 2000  vancomycin (VANCOCIN) IVPB 1000 mg/200 mL premix  Status:  Discontinued     1,000 mg 200 mL/hr over 60 Minutes Intravenous Every 12 hours 10/16/15 1324 10/16/15 1411   10/16/15 0845  clindamycin (CLEOCIN) IVPB 600 mg     600 mg 100 mL/hr over 30 Minutes Intravenous  Once 10/16/15 0833 10/16/15 0756   10/15/15 0815  vancomycin (VANCOCIN) IVPB 1000 mg/200 mL premix     1,000 mg 200 mL/hr over 60 Minutes Intravenous To [facility] 10/15/15 0800 10/16/15 0816   10/14/15 1500  vancomycin (VANCOCIN) IVPB 1000 mg/200 mL premix     1,000 mg 200 mL/hr over 60 Minutes Intravenous Every 12 hours 10/14/15 0603 10/14/15 1811   10/14/15 0315  vancomycin (VANCOCIN) IVPB 1000 mg/200 mL premix     1,000 mg 200 mL/hr over 60 Minutes Intravenous  Once 10/14/15 0305 10/14/15 0416     Code Status:     Code Status Orders        Start     Ordered   10/14/15 0524  Do not attempt resuscitation (DNR)   Continuous    Question Answer Comment  In the event of cardiac or respiratory ARREST Do not call a "code blue"   In the event of cardiac or respiratory ARREST Do not perform Intubation, CPR, defibrillation or ACLS   In the event of cardiac or respiratory ARREST Use medication by any route, position, wound care, and other measures  to relive pain and suffering. May use oxygen, suction and manual treatment of airway obstruction as needed for comfort.      10/14/15 0523    Code Status History    Date Active Date Inactive Code Status Order ID Comments User Context   10/14/2015  3:41 AM 10/14/2015  5:23 AM DNR [id number]  Lorretta Harp, MD ED    Advance Directive Documentation        Most Recent Value   Type of Advance Directive  Healthcare Power of Attorney, Living will   Pre-existing out of facility DNR order (yellow form or pink MOST form)     "MOST" Form in Place?       Family Communication: daughter at bedside Disposition Plan: will need SNF  DVT  prophylaxis: Lovenox Consultants: ortho Procedures:     Objective: Filed Weights   10/14/15 0056 10/17/15 0404  Weight: 67.132 kg (148 lb) 68.5 kg (151 lb 0.2 oz)    Intake/Output Summary (Last 24 hours) at 10/17/15 1625 Last data filed at 10/17/15 1300  Gross per 24 hour  Intake    120 ml  Output    651 ml  Net   -531 ml     Vitals Filed Vitals:   10/17/15 0226 10/17/15 0404 10/17/15 1046 10/17/15 1300  BP: 147/79 112/58  108/53  Pulse: 92 92  94  Temp: 97.7 F (36.5 C) 98.7 F (37.1 C)  98.8 F (37.1 C)  TempSrc: Axillary Axillary    Resp: 18 20  20   Height:      Weight:  68.5 kg (151 lb 0.2 oz)    SpO2: 91% 93% 94% 85%    Exam:  General:  Pt is sleeping, not in acute distress  HEENT: No icterus, No thrush, oral mucosa moist  Cardiovascular: regular rate and rhythm, S1/S2 No murmur  Respiratory: clear to auscultation bilaterally   Abdomen: Soft, +Bowel sounds, non tender, non distended, no guarding  MSK: No cyanosis or clubbing- no pedal edema- left leg in splint    Data Reviewed: Basic Metabolic Panel:  Recent Labs Lab 10/14/15 0052 10/14/15 0058 10/14/15 0535 10/15/15 0500 10/16/15 0528 10/17/15 0324  NA 135 136 135 135 137 135  K 4.4 4.3 4.2 3.9 3.7 3.9  CL 101 101 99* 96* 104 102  CO2 22  --  24 25 29 26   GLUCOSE 252* 249* 296* 201* 218* 235*  BUN 12 15 12 6 7 9   CREATININE 0.62 0.60 0.73 0.71 0.69 0.78  CALCIUM 8.9  --  8.4* 8.0* 8.5* 8.1*   Liver Function Tests:  Recent Labs Lab 10/14/15 0052 10/14/15 0535  AST 17 18  ALT 11* 12*  ALKPHOS 66 54  BILITOT 0.4 0.7  PROT 6.5 5.7*  ALBUMIN 3.5 3.0*   No results for input(s): LIPASE, AMYLASE in the last 168 hours. No results for input(s): AMMONIA in the last 168 hours. CBC:  Recent Labs Lab 10/14/15 0052  10/14/15 0535 10/15/15 0500 10/15/15 1819 10/16/15 0528 10/17/15 0324  WBC 10.9*  --  11.4* 7.9  --  8.0 10.2  HGB 12.3  < > 10.9* 8.9* 9.4* 9.8* 8.9*  HCT 40.1  < >  36.1 29.4* 31.1* 32.1* 27.6*  MCV 76.8*  --  77.5* 76.8*  --  78.3 77.7*  PLT 354  --  273 268  --  223 206  < > = values in this interval not displayed. Cardiac Enzymes: No results for input(s): CKTOTAL, CKMB, CKMBINDEX, TROPONINI in  the last 168 hours. BNP (last 3 results)  Recent Labs  10/14/15 0535  BNP 141.5*    ProBNP (last 3 results) No results for input(s): PROBNP in the last 8760 hours.  CBG:  Recent Labs Lab 10/16/15 1605 10/16/15 2226 10/17/15 0639 10/17/15 1110 10/17/15 1606  GLUCAP 246* 228* 237* 201* 233*    Recent Results (from the past 240 hour(s))  Surgical pcr screen     Status: None   Collection Time: 10/15/15 10:20 PM  Result Value Ref Range Status   MRSA, PCR NEGATIVE NEGATIVE Final   Staphylococcus aureus NEGATIVE NEGATIVE Final    Comment:        The Xpert SA Assay (FDA approved for NASAL specimens in patients over 28 years of age), is one component of a comprehensive surveillance program.  Test performance has been validated by [facility] for patients greater than or equal to 52 year old. It is not intended to diagnose infection nor to guide or monitor treatment.      Studies: Dg Chest Port 1 View  10/17/2015  CLINICAL DATA:  Hypoxia and recent femoral fracture EXAM: PORTABLE CHEST 1 VIEW COMPARISON:  10/15/2015 FINDINGS: Cardiac shadow is mildly enlarged but stable. The lungs are well aerated bilaterally. Mild atelectatic changes are noted in the left base with a small left pleural effusion. No acute bony abnormality is seen. IMPRESSION: Left basilar atelectasis and small left pleural effusion. Electronically Signed   By: Alcide Clever M.D.   On: 10/17/2015 12:03   Dg C-arm Gt 120 Min  10/16/2015  CLINICAL DATA:  Status post ORIF for distal left femoral fracture. 3 minutes, 44 seconds fluoro time reported EXAM: DG C-ARM GT 120 MIN none CONTRAST:  None FLUOROSCOPY TIME:  Fluoroscopy Time (in minutes and seconds): 3 minutes, 44 second  Number of Acquired Images:  6 COMPARISON:  None. FINDINGS: Six fluoro spot images are reviewed. There is intramedullary nailing of a distal femoral metadiaphyseal fracture. Alignment of the fracture is now near anatomic. The prosthetic knee joint appears appropriate position. IMPRESSION: Intraoperative fluoro spot images revealing ORIF of the distal left femoral meta diaphyseal fracture without evidence of immediate postprocedure complication. Electronically Signed   By: David  [place] M.D.   On: 10/16/2015 10:27   Dg Femur Port Min 2 Views Left  10/16/2015  CLINICAL DATA:  Intra medullary nailing for distal femur fracture EXAM: LEFT FEMUR PORTABLE 2 VIEWS COMPARISON:  Intraoperative images obtained earlier in the day FINDINGS: Frontal and lateral views were obtained. There is rod fixation through a comminuted fracture of the distal femoral diaphysis. There is displacement of several smaller fracture fragments with overall alignment of the major fracture fragments near anatomic. There are screws just superior to a total knee replacement on the left. The tibial and femoral prosthetic components appear intact. There is no evidence of dislocation. There is a small knee joint effusion. There are foci of arterial vascular calcification. IMPRESSION: Screw and rod fixation through a comminuted fracture of the distal femoral diaphysis with major fracture fragments in near anatomic alignment. Several smaller fragments are displaced medially and laterally. Total knee replacement present with the femoral and tibial prosthetic components appearing intact. No new fracture. No dislocation. Small knee joint effusion. Electronically Signed   By: Bretta Bang III M.D.   On: 10/16/2015 13:22   Dg Femur Port Min 2 Views Left  10/16/2015  CLINICAL DATA:  Distal LEFT femur fracture post ORIF EXAM: LEFT FEMUR PORTABLE 2 VIEWS COMPARISON:  Portable  exam 0959 hours compared to 10/14/2015 FINDINGS: Osseous demineralization. IM  nail with proximal and distal locking screws identified across day comminuted distal LEFT femoral metadiaphyseal fracture. Fracture fragments are minimally displaced. IM nail extends to near the previously identified LEFT knee prosthesis. No new focal bony abnormalities identified. IMPRESSION: Post nailing of a minimally displaced distal LEFT femoral meta diaphyseal fracture. Osseous demineralization and LEFT knee prosthesis again seen. Electronically Signed   By: Ulyses Southward M.D.   On: 10/16/2015 10:27    Scheduled Meds:  Scheduled Meds: . citalopram  10 mg Oral Daily  . dorzolamide-timolol  1 drop Both Eyes BID  . insulin aspart  0-15 Units Subcutaneous TID WC  . insulin aspart  0-5 Units Subcutaneous QHS  . pantoprazole  40 mg Oral Daily  . pravastatin  40 mg Oral q1800  . rivaroxaban  20 mg Oral Daily  . senna-docusate  2 tablet Oral QHS  . tiotropium  18 mcg Inhalation Daily   Continuous Infusions:    Time spent on care of this patient: 35 min   Mussa Groesbeck, MD 10/17/2015, 4:25 PM  LOS: 3 days   [facility] Office  [phone] Pager - Text Page per [email or web address] If 7PM-7AM, please contact night-coverage [email or web address]

## 2015-10-17 NOTE — Progress Notes (Signed)
Initial Nutrition Assessment  INTERVENTION:   Magic cup TID with meals, each supplement provides 290 kcal and 9 grams of protein  NUTRITION DIAGNOSIS:   Inadequate oral intake related to lethargy/confusion as evidenced by meal completion < 50%.  GOAL:   Patient will meet greater than or equal to 90% of their needs  MONITOR:   PO intake, Supplement acceptance, I & O's, Labs  REASON FOR ASSESSMENT:   Malnutrition Screening Tool    ASSESSMENT:   82 y.o. female PMH of af-fib on Xarelto, HTN, COPD, DM, s/p bilateral knee replacement by [name] 5-7 years ago, GERD, depression, congestive heart failure, who presents with left knee pain after fall. She is found to have a femur fracture, s/p repair.   Medications reviewed and include: senokot CBG's: 201-237 Nutrition-Focused physical exam completed. Findings are no fat depletion, mild muscle depletion, and no edema.  Per daughter, whom pt live with, pt has had no recent weight changes, good appetite PTA. Pt grazes but cares mostly for herself. Does not like ensure/boost. Eats a lot of sugar free popsicles.  Meal Completion: 25% Pt unable to answer any questions at this time.    Diet Order:  Diet regular Room service appropriate?: Yes; Fluid consistency:: Thin  Skin:  Reviewed, no issues  Last BM:  1/27  Height:   Ht Readings from Last 1 Encounters:  10/14/15  (1.6 m)   Weight:   Wt Readings from Last 1 Encounters:  10/17/15 151 lb 0.2 oz (68.5 kg)   Ideal Body Weight:  52.2 kg  BMI:  Body mass index is 26.76 kg/(m^2).  Estimated Nutritional Needs:   Kcal:  1400-1600  Protein:  70-80 grams  Fluid:  > 1.5 L/day  EDUCATION NEEDS:   No education needs identified at this time  Kendell Bane RD, LDN, CNSC [phone] Pager [phone] After Hours Pager

## 2015-10-17 NOTE — Progress Notes (Signed)
Results for SHANDREA, LUSK (MRN [id number]) as of 10/17/2015 12:49  Ref. Range 10/16/2015 10:53 10/16/2015 16:05 10/16/2015 22:26 10/17/2015 06:39 10/17/2015 11:10  Glucose-Capillary Latest Ref Range: 65-99 mg/dL 409 (H) 811 (H) 914 (H) 237 (H) 201 (H)  CBGs continue to be greater than 180 mg/dl.  Recommend increasing Novolog to RESISTANT TID & HS if blood sugars continue to be elevated. Will monitor blood sugars while in the hospital. Smith Mince RN BSN CDE

## 2015-10-17 NOTE — Progress Notes (Signed)
   Subjective:  Patient reports pain as mild to moderate.  Pt has been sleepy since surgery.  Objective:   VITALS:   Filed Vitals:   10/16/15 1300 10/16/15 2006 10/17/15 0226 10/17/15 0404  BP: 131/67 114/66 147/79 112/58  Pulse: 90 102 92 92  Temp: 98.4 F (36.9 C) 98.4 F (36.9 C) 97.7 F (36.5 C) 98.7 F (37.1 C)  TempSrc:  Axillary Axillary Axillary  Resp: Height:      Weight:    68.5 kg (151 lb 0.2 oz)  SpO2:  90% 91% 93%    ABD soft Sensation intact distally Intact pulses distally Dorsiflexion/Plantar flexion intact Incision: dressing C/D/I Compartment soft Knee immobilizer in place  Lab Results  Component Value Date   WBC 10.2 10/17/2015   HGB 8.9* 10/17/2015   HCT 27.6* 10/17/2015   MCV 77.7* 10/17/2015   PLT 206 10/17/2015   BMET    Component Value Date/Time   NA 135 10/17/2015 0324   K 3.9 10/17/2015 0324   CL 102 10/17/2015 0324   CO2 26 10/17/2015 0324   GLUCOSE 235* 10/17/2015 0324   BUN 9 10/17/2015 0324   CREATININE 0.78 10/17/2015 0324   CALCIUM 8.1* 10/17/2015 0324   GFRNONAA >60 10/17/2015 0324   GFRAA >60 10/17/2015 0324     Assessment/Plan: 1 Day Post-Op   Principal Problem:   Open femur fracture, left (HCC) Active Problems:   Diabetes mellitus without complication (HCC)   COPD (chronic obstructive pulmonary disease) (HCC)   Atrial fibrillation (HCC)   CHF (congestive heart failure) (HCC)   HLD (hyperlipidemia)   GERD (gastroesophageal reflux disease)   Femur fracture, left (HCC)   Fall   Chronic obstructive pulmonary disease (HCC)   Open fracture of left femur, type I or II, initial encounter   Knee immobilizer at all times NWB LLE DVT ppx: xarelto resumed today PT/OT Avoid narcotics Dispo: d/c planning   [name] [name], [name] [name] 10/17/2015, 8:03 AM   [name] Frederic, MD Cell [phone]

## 2015-10-18 ENCOUNTER — Inpatient Hospital Stay (HOSPITAL_COMMUNITY): Payer: Medicare Other

## 2015-10-18 ENCOUNTER — Encounter (HOSPITAL_COMMUNITY): Payer: Self-pay | Admitting: *Deleted

## 2015-10-18 DIAGNOSIS — E860 Dehydration: Secondary | ICD-10-CM

## 2015-10-18 DIAGNOSIS — I63439 Cerebral infarction due to embolism of unspecified posterior cerebral artery: Secondary | ICD-10-CM

## 2015-10-18 DIAGNOSIS — R5383 Other fatigue: Secondary | ICD-10-CM

## 2015-10-18 LAB — BLOOD GAS, ARTERIAL
Acid-Base Excess: 0.1 mmol/L (ref 0.0–2.0)
Bicarbonate: 22.8 mEq/L (ref 20.0–24.0)
DRAWN BY: 246861
O2 CONTENT: 5 L/min
O2 SAT: 90.3 %
PH ART: 7.518 — AB (ref 7.350–7.450)
Patient temperature: 98.6
TCO2: 23.7 mmol/L (ref 0–100)
pCO2 arterial: 28.2 mmHg — ABNORMAL LOW (ref 35.0–45.0)
pO2, Arterial: 55.8 mmHg — ABNORMAL LOW (ref 80.0–100.0)

## 2015-10-18 LAB — BASIC METABOLIC PANEL
Anion gap: 7 (ref 5–15)
BUN: 9 mg/dL (ref 6–20)
CHLORIDE: 103 mmol/L (ref 101–111)
CO2: 27 mmol/L (ref 22–32)
Calcium: 8.2 mg/dL — ABNORMAL LOW (ref 8.9–10.3)
Creatinine, Ser: 0.77 mg/dL (ref 0.44–1.00)
GFR calc Af Amer: >60 mL/min (ref >60)
GFR calc non Af Amer: >60 mL/min (ref >60)
GLUCOSE: 245 mg/dL — AB (ref 65–99)
POTASSIUM: 3.9 mmol/L (ref 3.5–5.1)
Sodium: 137 mmol/L (ref 135–145)

## 2015-10-18 LAB — GLUCOSE, CAPILLARY
GLUCOSE-CAPILLARY: 203 mg/dL — AB (ref 65–99)
GLUCOSE-CAPILLARY: 238 mg/dL — AB (ref 65–99)
GLUCOSE-CAPILLARY: 252 mg/dL — AB (ref 65–99)
Glucose-Capillary: 225 mg/dL — ABNORMAL HIGH (ref 65–99)
Glucose-Capillary: 170 mg/dL — ABNORMAL HIGH (ref 65–99)

## 2015-10-18 LAB — URINE MICROSCOPIC-ADD ON

## 2015-10-18 LAB — CBC
HEMATOCRIT: 27.3 % — AB (ref 36.0–46.0)
HEMOGLOBIN: 8.6 g/dL — AB (ref 12.0–15.0)
MCH: 25.2 pg — ABNORMAL LOW (ref 26.0–34.0)
MCHC: 31.5 g/dL (ref 30.0–36.0)
MCV: 80.1 fL (ref 78.0–100.0)
Platelets: 210 10*3/uL (ref 150–400)
RBC: 3.41 MIL/uL — ABNORMAL LOW (ref 3.87–5.11)
RDW: 18 % — AB (ref 11.5–15.5)
WBC: 10.2 10*3/uL (ref 4.0–10.5)

## 2015-10-18 LAB — URINALYSIS, ROUTINE W REFLEX MICROSCOPIC
BILIRUBIN URINE: NEGATIVE
Glucose, UA: 250 mg/dL — AB
KETONES UR: 40 mg/dL — AB
Leukocytes, UA: NEGATIVE
NITRITE: NEGATIVE
PROTEIN: 30 mg/dL — AB
pH: 8 (ref 5.0–8.0)

## 2015-10-18 LAB — D-DIMER, QUANTITATIVE: D-Dimer, Quant: 1.59 ug/mL-FEU — ABNORMAL HIGH (ref 0.00–0.50)

## 2015-10-18 LAB — AMMONIA: Ammonia: 25 umol/L (ref 9–35)

## 2015-10-18 MED ORDER — IOHEXOL 300 MG/ML  SOLN
50.0000 mL | Freq: Once | INTRAMUSCULAR | Status: AC | PRN
  Administered 2015-10-18: 50 mL via INTRAVENOUS

## 2015-10-18 MED ORDER — RIVAROXABAN 20 MG PO TABS: 20.0000 mg | ORAL_TABLET | ORAL | Status: DC

## 2015-10-18 MED ORDER — METFORMIN HCL ER 500 MG PO TB24: 500.0000 mg | ORAL_TABLET | Freq: Every day | ORAL | Status: DC

## 2015-10-18 MED ORDER — SODIUM CHLORIDE 0.9 % IV SOLN: INTRAVENOUS | Status: DC

## 2015-10-18 MED ORDER — SODIUM CHLORIDE 0.9 % IV BOLUS (SEPSIS)
1000.0000 mL | Freq: Once | INTRAVENOUS | Status: AC
  Administered 2015-10-18: 1000 mL via INTRAVENOUS

## 2015-10-18 MED ORDER — RIVAROXABAN 15 MG PO TABS
15.0000 mg | ORAL_TABLET | Freq: Every day | ORAL | Status: DC
  Administered 2015-10-18: 15 mg via ORAL
  Filled 2015-10-18: qty 1

## 2015-10-18 MED ORDER — ENSURE ENLIVE PO LIQD
237.0000 mL | Freq: Two times a day (BID) | ORAL | Status: DC
  Administered 2015-10-18 – 2015-10-23 (×7): 237 mL via ORAL

## 2015-10-18 NOTE — Progress Notes (Signed)
Orthopedics Progress Note  Subjective: Patient resting comfortably in bed.  She has been difficult to arouse per the daughter  Objective:  Filed Vitals:   10/18/15 0035 10/18/15 0617  BP: 129/61 151/91  Pulse: 56 81  Temp: 97.7 F (36.5 C) 98.4 F (36.9 C)  Resp: 18 18   Sats - 80 % on 2-3 liters, improved to 90% on 5 Liters General: Awake and alert  Musculoskeletal: dressing changed on her left leg, knee immobilizer replaced Neurovascularly intact  Lab Results  Component Value Date   WBC 10.2 10/18/2015   HGB 8.6* 10/18/2015   HCT 27.3* 10/18/2015   MCV 80.1 10/18/2015   PLT 210 10/18/2015       Component Value Date/Time   NA 137 10/18/2015 0449   K 3.9 10/18/2015 0449   CL 103 10/18/2015 0449   CO2 27 10/18/2015 0449   GLUCOSE 245* 10/18/2015 0449   BUN 9 10/18/2015 0449   CREATININE 0.77 10/18/2015 0449   CALCIUM 8.2* 10/18/2015 0449   GFRNONAA >60 10/18/2015 0449   GFRAA >60 10/18/2015 0449    Lab Results  Component Value Date   INR 1.11 10/14/2015    Assessment/Plan: POD #2 s/p Procedure(s): OPEN REDUCTION INTERNAL FIXATION (ORIF) DISTAL FEMUR FRACTURE Acute blood loss anemia - consider transfusion Hypoxia - respiratory therapy at bedside, nursing to contact internal medicine(primary) team to alert them to her status and see if she needs anything else Sats improved to 95% with Nasal Cannula positioned over her mouth   Almedia Balls. Ranell Patrick, MD 10/18/2015 9:07 AM

## 2015-10-18 NOTE — Consult Note (Signed)
Stroke Consult Consulting Physician: Dr Butler [facility]  Chief Complaint: altered mental status  HPI: [name] [name] is an 82 y.o. female hx of A fib, DM, HTN admitted 1/24 after a fall in which she suffered a left leg distal femur fracture, s/p ORIF left femur on 1/26. Neurology consulted after patient developed altered mental status with MRI brain showing multiple acute infarcts. Patient was on Xarelto at home which was stopped on admission for surgical intervention. Xarelto was restarted on 1/27.   MRI brain imaging reviewed, shows acute infarct in the left cerebellum, left PCA territory involving posterior medial temporal lobe and occipital lobe and multiple infarcts in left parietal lobe.   Date last known well: unclear Time last known well: unclear tPA Given: no, unclear LSW and on Xarelto Modified Rankin: Rankin Score=1  Past Medical History  Diagnosis Date  . Hypertension   . Diabetes mellitus without complication (HCC)   . COPD (chronic obstructive pulmonary disease) (HCC)   . Atrial fibrillation (HCC)   . Glaucoma     Bilaterally  . CHF (congestive heart failure) (HCC)   . HLD (hyperlipidemia)   . GERD (gastroesophageal reflux disease)   . Depression     Past Surgical History  Procedure Laterality Date  . Cardiac catheterization  2003  . Replacement total knee bilateral Bilateral   . I&d extremity Left 10/14/2015    Procedure: IRRIGATION AND DEBRIDEMENT LEFT LEG;  Surgeon: Venita Lick, MD;  Location: [facility] OR;  Service: Orthopedics;  Laterality: Left;  . Orif femur fracture Left 10/16/2015    Procedure: OPEN REDUCTION INTERNAL FIXATION (ORIF) DISTAL FEMUR FRACTURE;  Surgeon: Samson Frederic, MD;  Location: [facility] OR;  Service: Orthopedics;  Laterality: Left;    History reviewed. No pertinent family history. Social History:  reports that she has never smoked. She does not have any smokeless tobacco history on file. She reports that she does not drink alcohol. Her drug history is  not on file.  Allergies:  Allergies  Allergen Reactions  . Penicillins Other (See Comments)    Unknown reaction/childhood  Has patient had a PCN reaction causing immediate rash, facial/tongue/throat swelling, SOB or lightheadedness with hypotension: No Has patient had a PCN reaction causing severe rash involving mucus membranes or skin necrosis: No Has patient had a PCN reaction that required hospitalization No Has patient had a PCN reaction occurring within the last 10 years: No If all of the above answers are "NO", then may procee    Medications Prior to Admission  Medication Sig Dispense Refill  . acetaminophen (TYLENOL) 500 MG tablet Take 1,000 mg by mouth every 6 (six) hours as needed for mild pain.    . bacitracin ophthalmic ointment Place 1 application into both eyes 4 (four) times daily as needed for wound care. apply to eye    . citalopram (CELEXA) 10 MG tablet Take 10 mg by mouth daily.    . cyanocobalamin (,VITAMIN B-12,) 1000 MCG/ML injection Inject 1,000 mcg into the muscle every 30 (thirty) days.    . dorzolamide-timolol (COSOPT) 22.3-6.8 MG/ML ophthalmic solution Place 1 drop into both eyes 2 (two) times daily.    Marland Kitchen esomeprazole (NEXIUM) 40 MG capsule Take 40 mg by mouth daily before breakfast.     . isosorbide mononitrate (IMDUR) 60 MG 24 hr tablet Take 60 mg by mouth daily.    Marland Kitchen levalbuterol (XOPENEX HFA) 45 MCG/ACT inhaler Inhale 1-2 puffs into the lungs every 4 (four) hours as needed for wheezing.    Marland Kitchen  metFORMIN (GLUCOPHAGE-XR) 500 MG 24 hr tablet Take 500 mg by mouth every evening.    . nitroGLYCERIN (NITROLINGUAL) 0.4 MG/SPRAY spray Place 1 spray under the tongue every 5 (five) minutes x 3 doses as needed for chest pain.    Bertram Gala Glycol-Propyl Glycol (SYSTANE) 0.4-0.3 % SOLN Apply 1-2 drops to eye 4 (four) times daily as needed (dry eyes).     . pravastatin (PRAVACHOL) 40 MG tablet Take 40 mg by mouth daily.    . rivaroxaban (XARELTO) 20 MG TABS tablet Take 20  mg by mouth every morning.     . sitaGLIPtin (JANUVIA) 25 MG tablet Take 25 mg by mouth daily.    Marland Kitchen tiotropium (SPIRIVA) 18 MCG inhalation capsule Place 18 mcg into inhaler and inhale daily.    . verapamil (CALAN) 40 MG tablet Take 40 mg by mouth 2 (two) times daily.      ROS: Out of a complete 14 system review, the patient complains of only the following symptoms, and all other reviewed systems are negative. +altered mental status, weakness   Physical Examination: Filed Vitals:   10/18/15 1532 10/18/15 2028  BP: 142/78 147/82  Pulse: 80 80  Temp: 98.5 F (36.9 C) 98.1 F (36.7 C)  Resp: 18 18   Physical Exam  Constitutional: He appears well-developed and well-nourished.  Psych: Affect appropriate to situation Eyes: No scleral injection HENT: No OP obstrucion Head: Normocephalic.  Cardiovascular: Normal rate and regular rhythm.  Respiratory: Effort normal and breath sounds normal.  GI: Soft. Bowel sounds are normal. No distension. There is no tenderness.  Skin: WDI  Neurologic Examination: Mental Status: Lethargic but opens eyes to voice, oriented to name only. Minimal verbal output, unable to identify items. Not following commands. . Cranial Nerves: II: optic discs not visualized, absent blink to threat on the right, appears to neglect right side, pupils equal, round, reactive to light III,IV, VI: ptosis not present, left gaze preference, eyes will not track past midline V,VII: face symmetric, facial light touch sensation normal bilaterally VIII: hearing normal bilaterally IX,X: gag reflex present XI: trapezius strength/neck flexion strength normal bilaterally XII: tongue strength normal  Motor: Appears to move LUE against gravity and light resistance, limited movement of RUE. Limited movement RLE. Did not test LLE due to recent surgery Sensory: grimaces to noxious stimuli in all extremities Deep Tendon Reflexes: 1+ and symmetric throughout Plantars: Right:  downgoing   Left: downgoing Cerebellar: Unable to test Gait: unable to test  Laboratory Studies:   Basic Metabolic Panel:  Recent Labs Lab 10/14/15 0535 10/15/15 0500 10/16/15 0528 10/17/15 0324 10/18/15 0449  NA 135 135 137 135 137  K 4.2 3.9 3.7 3.9 3.9  CL 99* 96* 104 102 103  CO2 GLUCOSE 296* 201* 218* 235* 245*  BUN CREATININE 0.73 0.71 0.69 0.78 0.77  CALCIUM 8.4* 8.0* 8.5* 8.1* 8.2*    Liver Function Tests:  Recent Labs Lab 10/14/15 0052 10/14/15 0535  AST 17 18  ALT 11* 12*  ALKPHOS 66 54  BILITOT 0.4 0.7  PROT 6.5 5.7*  ALBUMIN 3.5 3.0*   No results for input(s): LIPASE, AMYLASE in the last 168 hours.  Recent Labs Lab 10/18/15 1738  AMMONIA 25    CBC:  Recent Labs Lab 10/14/15 0535 10/15/15 0500 10/15/15 1819 10/16/15 0528 10/17/15 0324 10/18/15 0449  WBC 11.4* 7.9  --  8.0 10.2 10.2  HGB 10.9* 8.9* 9.4* 9.8* 8.9*  8.6*  HCT 36.1 29.4* 31.1* 32.1* 27.6* 27.3*  MCV 77.5* 76.8*  --  78.3 77.7* 80.1  PLT 273 268  --  223 206 210    Cardiac Enzymes: No results for input(s): CKTOTAL, CKMB, CKMBINDEX, TROPONINI in the last 168 hours.  BNP: Invalid input(s): POCBNP  CBG:  Recent Labs Lab 10/18/15 0641 10/18/15 1126 10/18/15 1713 10/18/15 1715 10/18/15 2129  GLUCAP 225* 203* 238* 252* 170*    Microbiology: Results for orders placed or performed during the hospital encounter of 10/14/15  Surgical pcr screen     Status: None   Collection Time: 10/15/15 10:20 PM  Result Value Ref Range Status   MRSA, PCR NEGATIVE NEGATIVE Final   Staphylococcus aureus NEGATIVE NEGATIVE Final    Comment:        The Xpert SA Assay (FDA approved for NASAL specimens in patients over 51 years of age), is one component of a comprehensive surveillance program.  Test performance has been validated by [facility] for patients greater than or equal to 63 year old. It is not intended to diagnose infection nor to guide or  monitor treatment.     Coagulation Studies: No results for input(s): LABPROT, INR in the last 72 hours.  Urinalysis:  Recent Labs Lab 10/18/15 1517  COLORURINE YELLOW  LABSPEC >1.046*  PHURINE 8.0  GLUCOSEU 250*  HGBUR SMALL*  BILIRUBINUR NEGATIVE  KETONESUR 40*  PROTEINUR 30*  NITRITE NEGATIVE  LEUKOCYTESUR NEGATIVE    Lipid Panel:     Component Value Date/Time   CHOL 172 10/14/2015 0535   TRIG 61 10/14/2015 0535   HDL 39* 10/14/2015 0535   CHOLHDL 4.4 10/14/2015 0535   VLDL 12 10/14/2015 0535   LDLCALC 121* 10/14/2015 0535    HgbA1C:  Lab Results  Component Value Date   HGBA1C 8.3* 10/14/2015    Urine Drug Screen:  No results found for: LABOPIA, COCAINSCRNUR, LABBENZ, AMPHETMU, THCU, LABBARB  Alcohol Level:  Recent Labs Lab 10/14/15 0120  ETH <5    Other results:  Imaging: Ct Head W Wo Contrast  10/18/2015  CLINICAL DATA:  Recent orthopedic surgery.  Confusion. EXAM: CT HEAD WITHOUT AND WITH CONTRAST TECHNIQUE: Contiguous axial images were obtained from the base of the skull through the vertex without and with intravenous contrast CONTRAST:  50mL OMNIPAQUE IOHEXOL 300 MG/ML  SOLN COMPARISON:  10/14/2015 FINDINGS: There is newly seen low density in the left cerebellum that could be a recent infarction. Cerebral hemispheres show atrophy and chronic small vessel disease but no sign of supratentorial acute infarction. No mass lesion, hemorrhage, hydrocephalus or extra-axial collection. No abnormal contrast enhancement. IMPRESSION: Brain atrophy. Newly seen low density in the left cerebellum that could represent an acute infarction. Electronically Signed   By: Paulina Fusi M.D.   On: 10/18/2015 14:30   Mr Brain Wo Contrast  10/18/2015  CLINICAL DATA:  Acute onset of mental status changes. Abnormal head CT suggesting cerebellar infarction. EXAM: MRI HEAD WITHOUT CONTRAST TECHNIQUE: Multiplanar, multiecho pulse sequences of the brain and surrounding structures were  obtained without intravenous contrast. COMPARISON:  Head CT same day FINDINGS: Diffusion imaging shows acute infarction in the left cerebellum at the site of the CT hypodensity. In addition, there is infarction throughout the left posterior cerebral artery territory affecting the posterior medial temporal lobe and occipital lobe as well as focal involvement of lateral thalamus. There is also some acute infarction in the right occipital cortex. Acute infarction is present in the left parietal cortical  and subcortical brain and in the posterior left frontal deep white matter. There is no detectable hemorrhage. No mass effect or shift. No hydrocephalus or extra-axial collection. IMPRESSION: Acute infarction in the left cerebellum as suggested by CT. Acute infarction throughout the left posterior cerebral artery territory affecting the posterior medial temporal lobe and occipital lobe with focal involvement of the lateral thalamus. Additional acute infarction in the left parietal cortical and subcortical brain. Additional sub cm infarction in the left posterior frontal deep white matter. Electronically Signed   By: Paulina Fusi M.D.   On: 10/18/2015 19:43   Dg Chest Port 1 View  10/17/2015  CLINICAL DATA:  Hypoxia and recent femoral fracture EXAM: PORTABLE CHEST 1 VIEW COMPARISON:  10/15/2015 FINDINGS: Cardiac shadow is mildly enlarged but stable. The lungs are well aerated bilaterally. Mild atelectatic changes are noted in the left base with a small left pleural effusion. No acute bony abnormality is seen. IMPRESSION: Left basilar atelectasis and small left pleural effusion. Electronically Signed   By: Alcide Clever M.D.   On: 10/17/2015 12:03    Assessment: 82 y.o. female hx of A fib (on Xarelto), DM, HTN admitted with a left leg distal fracture s/p ORIF on 1/26 noted to have altered mental status. MRI brain shows multiple infarcts concerning for embolic infarcts. Patient does have history of A fib and Xarelto  was transiently stopped for surgery. With recent fracture also consider fat emboli.   Discussed with family the risk/benefits of continuing Xarelto in the setting of multiple infarcts. Counseled them that there is an increased risk of hemorrhagic conversion of infarcts but this needs to be balanced with the risk of possible further ischemic events. They expressed understanding and agreed to continuation of Xarelto.   Plan: 1. HgbA1c, fasting lipid panel 2. MRI, MRA  of the brain without contrast 3. PT consult, OT consult, Speech consult 4. Echocardiogram 5. Carotid dopplers 6. Prophylactic therapy-continue Xarelto 7. Risk factor modification 8. Telemetry monitoring 9. Frequent neuro checks 10. NPO until RN stroke swallow screen    Elspeth Cho, DO [facility] [phone]  If 7pm- 7am, please page neurology on call as listed in AMION. 10/18/2015, 11:32 PM

## 2015-10-18 NOTE — Progress Notes (Signed)
[facility] Progress Note   [name] [name]  GEX:[id number]  DOB: 1922-05-12  DOA: 10/14/2015 PCP: [name], MD  Brief narrative: [name] [name] is a 82 y.o. female PMH of af-fib on Xarelto, HTN, COPD, DM, s/p bilateral knee replacement by [name] 5-7 years ago, GERD, depression, congestive heart failure, who presents with left knee pain after fall. She is found to have a periprothetic knee fracture   Subjective: Opening eyes but not focusing. Very little communication.   Assessment/Plan: Open periprosthetic femur fracture, left - went to OR for washing on 1/24 and then underwent ORIF on 1/26 - management per ortho-  Non-weight bearing   Lethargy -  Awakening very little after OR on 1/26 - mild cerebellar CVA seen on CT head-doubtful this is causing her lethargy but may have led to her initial fall- obtain MRI - no infection noted on UA - obtain ammonia level  - may all be due to dehydration- due to h/o CHF, I have not been aggressively hydrating her - will increase fluids to see if mental status improves-  see below  Dehydration - based upon UA - with contraction alkalosis  - will bolus with 1 L NS today and increase continuous fluids  Hypoxemia - due to atelectasis  Anemia - Hb dropped from 12 to 8.9 (due to acute blood loss from knee fracture?) given 1 U PRBC by ortho prior to surgery - Hb improved to about 9.8 and now in 8-9 range  HTN -  cont to hold Verapamil for now   Leukocytosis:  Likely due to stress-induced demargination. Patient does not have signs of infection and WBC count has normalized  Atrial Fibrillation:  CHA2DS2-VASc Score is 6- Patient is on Xarelto at home.  - resumed Xarelto today    DM-II:  - Last A1c not on record. Patient is taking metformin and Januvia at home - SSI while here - A1c is 8.3  COPD:  - stable. -Continue Xopenex inhaler, Spiriva inhaler  CHF (congestive heart failure) (HCC):  - No 2-D echo on record, not sure  which type of CHF. Patient is on Lasix. No leg edema. CHF is compensated.  -Hold her Lasix for now as she is compensated  HLD: - Last LDL was not on record- current LDL is 121 -Continue home medications: Pravastatin  GERD: -Protonix  Depression:  -Stable, no suicidal or homicidal ideations. -Continue home medications: Celexa    Antibiotics: Anti-infectives    Start     Dose/Rate Route Frequency Ordered Stop   10/17/15 0600  vancomycin (VANCOCIN) IVPB 750 mg/150 ml premix     750 mg 150 mL/hr over 60 Minutes Intravenous Every 24 hours 10/16/15 1411 10/17/15 0753   10/16/15 2000  vancomycin (VANCOCIN) IVPB 1000 mg/200 mL premix  Status:  Discontinued     1,000 mg 200 mL/hr over 60 Minutes Intravenous Every 12 hours 10/16/15 1324 10/16/15 1411   10/16/15 0845  clindamycin (CLEOCIN) IVPB 600 mg     600 mg 100 mL/hr over 30 Minutes Intravenous  Once 10/16/15 0833 10/16/15 0756   10/15/15 0815  vancomycin (VANCOCIN) IVPB 1000 mg/200 mL premix     1,000 mg 200 mL/hr over 60 Minutes Intravenous To [facility] 10/15/15 0800 10/16/15 0816   10/14/15 1500  vancomycin (VANCOCIN) IVPB 1000 mg/200 mL premix     1,000 mg 200 mL/hr over 60 Minutes Intravenous Every 12 hours 10/14/15 0603 10/14/15 1811   10/14/15 0315  vancomycin (VANCOCIN) IVPB 1000 mg/200 mL premix  1,000 mg 200 mL/hr over 60 Minutes Intravenous  Once 10/14/15 0305 10/14/15 0416     Code Status:     Code Status Orders        Start     Ordered   10/14/15 0524  Do not attempt resuscitation (DNR)   Continuous    Question Answer Comment  In the event of cardiac or respiratory ARREST Do not call a "code blue"   In the event of cardiac or respiratory ARREST Do not perform Intubation, CPR, defibrillation or ACLS   In the event of cardiac or respiratory ARREST Use medication by any route, position, wound care, and other measures to relive pain and suffering. May use oxygen, suction and manual treatment of  airway obstruction as needed for comfort.      10/14/15 0523    Code Status History    Date Active Date Inactive Code Status Order ID Comments User Context   10/14/2015  3:41 AM 10/14/2015  5:23 AM DNR [id number]  Lorretta Harp, MD ED    Advance Directive Documentation        Most Recent Value   Type of Advance Directive  Healthcare Power of Attorney, Living will   Pre-existing out of facility DNR order (yellow form or pink MOST form)     "MOST" Form in Place?       Family Communication: daughter at bedside Disposition Plan: will need SNF  DVT prophylaxis: Lovenox Consultants: ortho Procedures:     Objective: Filed Weights   10/14/15 0056 10/17/15 0404 10/18/15 0617  Weight: 67.132 kg (148 lb) 68.5 kg (151 lb 0.2 oz) 77.3 kg (170 lb 6.7 oz)    Intake/Output Summary (Last 24 hours) at 10/18/15 1702 Last data filed at 10/18/15 4098  Gross per 24 hour  Intake    600 ml  Output    450 ml  Net    150 ml     Vitals Filed Vitals:   10/18/15 0035 10/18/15 0617 10/18/15 0904 10/18/15 1532  BP: 129/61 151/91  142/78  Pulse: 56 81  80  Temp: 97.7 F (36.5 C) 98.4 F (36.9 C)  98.5 F (36.9 C)  TempSrc: Axillary Axillary    Resp: Height:      Weight:  77.3 kg (170 lb 6.7 oz)    SpO2: 93% 93% 93% 93%    Exam:  General:  Pt is sleeping, opens eyes and does not focus on me- does not communicate- not in acute distress  HEENT: No icterus, No thrush, oral mucosa moist  Cardiovascular: regular rate and rhythm, S1/S2 No murmur  Respiratory: clear to auscultation bilaterally   Abdomen: Soft, +Bowel sounds, non tender, non distended, no guarding  MSK: No cyanosis or clubbing- no pedal edema- left leg in splint    Data Reviewed: Basic Metabolic Panel:  Recent Labs Lab 10/14/15 0535 10/15/15 0500 10/16/15 0528 10/17/15 0324 10/18/15 0449  NA 135 135 137 135 137  K 4.2 3.9 3.7 3.9 3.9  CL 99* 96* 104 102 103  CO2 GLUCOSE 296* 201* 218* 235*  245*  BUN CREATININE 0.73 0.71 0.69 0.78 0.77  CALCIUM 8.4* 8.0* 8.5* 8.1* 8.2*   Liver Function Tests:  Recent Labs Lab 10/14/15 0052 10/14/15 0535  AST 17 18  ALT 11* 12*  ALKPHOS 66 54  BILITOT 0.4 0.7  PROT 6.5 5.7*  ALBUMIN 3.5 3.0*   No results  for input(s): LIPASE, AMYLASE in the last 168 hours. No results for input(s): AMMONIA in the last 168 hours. CBC:  Recent Labs Lab 10/14/15 0535 10/15/15 0500 10/15/15 1819 10/16/15 0528 10/17/15 0324 10/18/15 0449  WBC 11.4* 7.9  --  8.0 10.2 10.2  HGB 10.9* 8.9* 9.4* 9.8* 8.9* 8.6*  HCT 36.1 29.4* 31.1* 32.1* 27.6* 27.3*  MCV 77.5* 76.8*  --  78.3 77.7* 80.1  PLT 273 268  --  223 206 210   Cardiac Enzymes: No results for input(s): CKTOTAL, CKMB, CKMBINDEX, TROPONINI in the last 168 hours. BNP (last 3 results)  Recent Labs  10/14/15 0535  BNP 141.5*    ProBNP (last 3 results) No results for input(s): PROBNP in the last 8760 hours.  CBG:  Recent Labs Lab 10/17/15 1110 10/17/15 1606 10/17/15 2138 10/18/15 0641 10/18/15 1126  GLUCAP 201* 233* 250* 225* 203*    Recent Results (from the past 240 hour(s))  Surgical pcr screen     Status: None   Collection Time: 10/15/15 10:20 PM  Result Value Ref Range Status   MRSA, PCR NEGATIVE NEGATIVE Final   Staphylococcus aureus NEGATIVE NEGATIVE Final    Comment:        The Xpert SA Assay (FDA approved for NASAL specimens in patients over 16 years of age), is one component of a comprehensive surveillance program.  Test performance has been validated by [facility] for patients greater than or equal to 78 year old. It is not intended to diagnose infection nor to guide or monitor treatment.      Studies: Ct Head W Wo Contrast  10/18/2015  CLINICAL DATA:  Recent orthopedic surgery.  Confusion. EXAM: CT HEAD WITHOUT AND WITH CONTRAST TECHNIQUE: Contiguous axial images were obtained from the base of the skull through the vertex without and with  intravenous contrast CONTRAST:  50mL OMNIPAQUE IOHEXOL 300 MG/ML  SOLN COMPARISON:  10/14/2015 FINDINGS: There is newly seen low density in the left cerebellum that could be a recent infarction. Cerebral hemispheres show atrophy and chronic small vessel disease but no sign of supratentorial acute infarction. No mass lesion, hemorrhage, hydrocephalus or extra-axial collection. No abnormal contrast enhancement. IMPRESSION: Brain atrophy. Newly seen low density in the left cerebellum that could represent an acute infarction. Electronically Signed   By: Paulina Fusi M.D.   On: 10/18/2015 14:30   Dg Chest Port 1 View  10/17/2015  CLINICAL DATA:  Hypoxia and recent femoral fracture EXAM: PORTABLE CHEST 1 VIEW COMPARISON:  10/15/2015 FINDINGS: Cardiac shadow is mildly enlarged but stable. The lungs are well aerated bilaterally. Mild atelectatic changes are noted in the left base with a small left pleural effusion. No acute bony abnormality is seen. IMPRESSION: Left basilar atelectasis and small left pleural effusion. Electronically Signed   By: Alcide Clever M.D.   On: 10/17/2015 12:03    Scheduled Meds:  Scheduled Meds: . citalopram  10 mg Oral Daily  . dorzolamide-timolol  1 drop Both Eyes BID  . feeding supplement (ENSURE ENLIVE)  237 mL Oral BID BM  . insulin aspart  0-15 Units Subcutaneous TID WC  . insulin aspart  0-5 Units Subcutaneous QHS  . metFORMIN  500 mg Oral q1800  . pantoprazole  40 mg Oral Daily  . pravastatin  40 mg Oral q1800  . rivaroxaban  15 mg Oral Daily  . senna-docusate  2 tablet Oral QHS  . sodium chloride  1,000 mL Intravenous Once  . tiotropium  18 mcg Inhalation Daily  Continuous Infusions: . sodium chloride      Time spent on care of this patient: 35 min   Tiona Ruane, MD 10/18/2015, 5:02 PM  LOS: 4 days   [facility] Office  [phone] Pager - Text Page per [email or web address] If 7PM-7AM, please contact night-coverage [email or web address]

## 2015-10-18 NOTE — Progress Notes (Signed)
Pt is still drowsy, will not respond to questions. Does respond to pain when bathed this morning. Foley left in until MD rounds today per family request. Foley care provided during night shift. Output 400. VSS. Pt in no signs of distress. Nursing will continue to monitor.

## 2015-10-19 ENCOUNTER — Encounter (HOSPITAL_COMMUNITY): Payer: Self-pay | Admitting: Radiology

## 2015-10-19 ENCOUNTER — Inpatient Hospital Stay (HOSPITAL_COMMUNITY): Payer: Medicare Other

## 2015-10-19 DIAGNOSIS — I639 Cerebral infarction, unspecified: Secondary | ICD-10-CM | POA: Insufficient documentation

## 2015-10-19 DIAGNOSIS — I6340 Cerebral infarction due to embolism of unspecified cerebral artery: Secondary | ICD-10-CM

## 2015-10-19 DIAGNOSIS — I2699 Other pulmonary embolism without acute cor pulmonale: Secondary | ICD-10-CM

## 2015-10-19 DIAGNOSIS — I6330 Cerebral infarction due to thrombosis of unspecified cerebral artery: Secondary | ICD-10-CM | POA: Insufficient documentation

## 2015-10-19 LAB — LIPID PANEL
CHOL/HDL RATIO: 4.2 ratio
Cholesterol: 114 mg/dL (ref 0–200)
HDL: 27 mg/dL — ABNORMAL LOW (ref >40)
LDL CALC: 59 mg/dL (ref 0–99)
Triglycerides: 142 mg/dL (ref <150)
VLDL: 28 mg/dL (ref 0–40)

## 2015-10-19 LAB — BASIC METABOLIC PANEL
ANION GAP: 7 (ref 5–15)
BUN: 9 mg/dL (ref 6–20)
CHLORIDE: 107 mmol/L (ref 101–111)
CO2: 25 mmol/L (ref 22–32)
Calcium: 7.8 mg/dL — ABNORMAL LOW (ref 8.9–10.3)
Creatinine, Ser: 0.72 mg/dL (ref 0.44–1.00)
GFR calc Af Amer: >60 mL/min (ref >60)
GLUCOSE: 200 mg/dL — AB (ref 65–99)
POTASSIUM: 3.5 mmol/L (ref 3.5–5.1)
Sodium: 139 mmol/L (ref 135–145)

## 2015-10-19 LAB — GLUCOSE, CAPILLARY
GLUCOSE-CAPILLARY: 177 mg/dL — AB (ref 65–99)
GLUCOSE-CAPILLARY: 186 mg/dL — AB (ref 65–99)
Glucose-Capillary: 172 mg/dL — ABNORMAL HIGH (ref 65–99)
Glucose-Capillary: 191 mg/dL — ABNORMAL HIGH (ref 65–99)

## 2015-10-19 LAB — CBC
HEMATOCRIT: 27.6 % — AB (ref 36.0–46.0)
Hemoglobin: 8.6 g/dL — ABNORMAL LOW (ref 12.0–15.0)
MCH: 25.1 pg — AB (ref 26.0–34.0)
MCHC: 31.2 g/dL (ref 30.0–36.0)
MCV: 80.7 fL (ref 78.0–100.0)
Platelets: 269 10*3/uL (ref 150–400)
RBC: 3.42 MIL/uL — ABNORMAL LOW (ref 3.87–5.11)
RDW: 19.2 % — ABNORMAL HIGH (ref 11.5–15.5)
WBC: 9.5 10*3/uL (ref 4.0–10.5)

## 2015-10-19 LAB — VITAMIN B12: VITAMIN B 12: 392 pg/mL (ref 180–914)

## 2015-10-19 LAB — TSH: TSH: 1.853 u[IU]/mL (ref 0.350–4.500)

## 2015-10-19 LAB — HEPARIN LEVEL (UNFRACTIONATED): Heparin Unfractionated: 0.98 IU/mL — ABNORMAL HIGH (ref 0.30–0.70)

## 2015-10-19 LAB — APTT
aPTT: 59 seconds — ABNORMAL HIGH (ref 24–37)
aPTT: 70 seconds — ABNORMAL HIGH (ref 24–37)

## 2015-10-19 MED ORDER — TIMOLOL MALEATE 0.25 % OP SOLN
1.0000 [drp] | Freq: Two times a day (BID) | OPHTHALMIC | Status: DC
  Administered 2015-10-19 – 2015-10-23 (×9): 1 [drp] via OPHTHALMIC
  Filled 2015-10-19 (×3): qty 5

## 2015-10-19 MED ORDER — POLYVINYL ALCOHOL 1.4 % OP SOLN: 1.0000 [drp] | Freq: Four times a day (QID) | OPHTHALMIC | Status: DC | PRN

## 2015-10-19 MED ORDER — HEPARIN (PORCINE) IN NACL 100-0.45 UNIT/ML-% IJ SOLN
1100.0000 [IU]/h | INTRAMUSCULAR | Status: AC
  Administered 2015-10-19: 1000 [IU]/h via INTRAVENOUS
  Administered 2015-10-20 – 2015-10-21 (×2): 1100 [IU]/h via INTRAVENOUS
  Filled 2015-10-19 (×5): qty 250

## 2015-10-19 MED ORDER — ENSURE ENLIVE PO LIQD: 237.0000 mL | Freq: Two times a day (BID) | ORAL | Status: DC

## 2015-10-19 MED ORDER — DORZOLAMIDE HCL 2 % OP SOLN
1.0000 [drp] | Freq: Two times a day (BID) | OPHTHALMIC | Status: DC
  Administered 2015-10-19 – 2015-10-23 (×9): 1 [drp] via OPHTHALMIC
  Filled 2015-10-19 (×2): qty 10

## 2015-10-19 MED ORDER — ATORVASTATIN CALCIUM 40 MG PO TABS
40.0000 mg | ORAL_TABLET | Freq: Every day | ORAL | Status: DC
  Administered 2015-10-20: 40 mg via ORAL
  Filled 2015-10-19 (×2): qty 1

## 2015-10-19 MED ORDER — STROKE: EARLY STAGES OF RECOVERY BOOK
Freq: Once | Status: AC
  Administered 2015-10-20
  Filled 2015-10-19 (×2): qty 1

## 2015-10-19 MED ORDER — IOHEXOL 350 MG/ML SOLN
100.0000 mL | Freq: Once | INTRAVENOUS | Status: AC | PRN
  Administered 2015-10-19: 70 mL via INTRAVENOUS

## 2015-10-19 NOTE — Progress Notes (Addendum)
ANTICOAGULATION CONSULT NOTE - Initial Consult  Pharmacy Consult for heparin Indication: pulmonary embolus and stroke  Allergies  Allergen Reactions  . Penicillins Other (See Comments)    Unknown reaction/childhood  Has patient had a PCN reaction causing immediate rash, facial/tongue/throat swelling, SOB or lightheadedness with hypotension: { Has patient had a PCN reaction causing severe rash involving mucus membranes or skin necrosis: { Has patient had a PCN reaction that required hospitalization { Has patient had a PCN reaction occurring within the last 10 years: { If all of the above answers are "NO", then may procee    Patient Measurements: Height:  (160 cm) Weight: 170 lb 6.7 oz (77.3 kg) IBW/kg (Calculated) : 52.4 Heparin Dosing Weight: 70kg  Vital Signs: Temp: 98.1 F (36.7 C) (01/28 2028) Temp Source: Axillary (01/28 2028) BP: 147/82 mmHg (01/28 2028) Pulse Rate: 80 (01/28 2028)  Labs:  Recent Labs  10/16/15 0528 10/17/15 0324 10/18/15 0449  HGB 9.8* 8.9* 8.6*  HCT 32.1* 27.6* 27.3*  PLT 223 206 210  CREATININE 0.69 0.78 0.77    Estimated Creatinine Clearance: 45.1 mL/min (by C-G formula based on Cr of 0.77).   Medical History: Past Medical History  Diagnosis Date  . Hypertension   . Diabetes mellitus without complication (HCC)   . COPD (chronic obstructive pulmonary disease) (HCC)   . Atrial fibrillation (HCC)   . Glaucoma     Bilaterally  . CHF (congestive heart failure) (HCC)   . HLD (hyperlipidemia)   . GERD (gastroesophageal reflux disease)   . Depression     Medications:  Prescriptions prior to admission  Medication Sig Dispense Refill Last Dose  . acetaminophen (TYLENOL) 500 MG tablet Take 1,000 mg by mouth every 6 (six) hours as needed for mild pain.   Past Month at Unknown time  . bacitracin ophthalmic ointment Place 1 application into both eyes 4 (four) times daily as needed for wound care. apply to eye   Past Month at Unknown  time  . citalopram (CELEXA) 10 MG tablet Take 10 mg by mouth daily.   10/12/2015 at unknown  . cyanocobalamin (,VITAMIN B-12,) 1000 MCG/ML injection Inject 1,000 mcg into the muscle every 30 (thirty) days.   10/09/2015 at Unknown time  . dorzolamide-timolol (COSOPT) 22.3-6.8 MG/ML ophthalmic solution Place 1 drop into both eyes 2 (two) times daily.   10/13/2015 at Unknown time  . esomeprazole (NEXIUM) 40 MG capsule Take 40 mg by mouth daily before breakfast.    10/13/2015 at Unknown time  . isosorbide mononitrate (IMDUR) 60 MG 24 hr tablet Take 60 mg by mouth daily.   10/13/2015 at Unknown time  . levalbuterol (XOPENEX HFA) 45 MCG/ACT inhaler Inhale 1-2 puffs into the lungs every 4 (four) hours as needed for wheezing.   unknown at unknown  . metFORMIN (GLUCOPHAGE-XR) 500 MG 24 hr tablet Take 500 mg by mouth every evening.   10/13/2015 at Unknown time  . nitroGLYCERIN (NITROLINGUAL) 0.4 MG/SPRAY spray Place 1 spray under the tongue every 5 (five) minutes x 3 doses as needed for chest pain.   unknown at unknown  . Polyethyl Glycol-Propyl Glycol (SYSTANE) 0.4-0.3 % SOLN Apply 1-2 drops to eye 4 (four) times daily as needed (dry eyes).    10/13/2015 at Unknown time  . pravastatin (PRAVACHOL) 40 MG tablet Take 40 mg by mouth daily.   10/13/2015 at Unknown time  . rivaroxaban (XARELTO) 20 MG TABS tablet Take 20 mg by mouth every morning.    10/12/2015 at unknown  .  sitaGLIPtin (JANUVIA) 25 MG tablet Take 25 mg by mouth daily.   10/13/2015 at Unknown time  . tiotropium (SPIRIVA) 18 MCG inhalation capsule Place 18 mcg into inhaler and inhale daily.   10/13/2015 at Unknown time  . verapamil (CALAN) 40 MG tablet Take 40 mg by mouth 2 (two) times daily.   10/13/2015 at Unknown time   Scheduled:  .  stroke: mapping our early stages of recovery book   Does not apply Once  . citalopram  10 mg Oral Daily  . dorzolamide-timolol  1 drop Both Eyes BID  . feeding supplement (ENSURE ENLIVE)  237 mL Oral BID BM  . insulin  aspart  0-15 Units Subcutaneous TID WC  . insulin aspart  0-5 Units Subcutaneous QHS  . pantoprazole  40 mg Oral Daily  . pravastatin  40 mg Oral q1800  . senna-docusate  2 tablet Oral QHS  . tiotropium  18 mcg Inhalation Daily   Infusions:  . sodium chloride      Assessment: 82yo female in unfortunate situation s/p stroke after surgery, now w/ PE confirmed on CT w/ signs of RHS, has been on Xarelto for secondary stroke prevention but probably not a good candidate for full-dose Xarelto for PE, to transition to heparin for now.  Goal of Therapy:  Heparin level 0.3-0.5 units/ml aPTT 66-84 seconds Monitor platelets by anticoagulation protocol: Yes   Plan:  Will start heparin gtt at 1000 units/hr and monitor heparin levels, PTT (will need to dose on PTT until Xarelto cleared as it will affect heparin levels), and CBC.  Vernard Gambles, PharmD, BCPS  10/19/2015,2:36 AM

## 2015-10-19 NOTE — Progress Notes (Addendum)
[facility] Progress Note   [name] [name]  ZOX:[id number]  DOB: July 25, 1922  DOA: 10/14/2015 PCP: [name], MD  Brief narrative: [name] [name] is a 82 y.o. female PMH of af-fib on Xarelto, HTN, COPD, DM, s/p bilateral knee replacement by [name] 5-7 years ago, GERD, depression, congestive heart failure, who presents with left knee pain after fall. She is found to have a periprothetic knee fracture   Subjective: More communicative today. No complaint of pain, dyspnea, cough, nausea, diarrhea.   Assessment/Plan: Open periprosthetic femur fracture, left - went to OR for washing on 1/24 and then underwent ORIF on 1/26 - management per ortho-  Non-weight bearing- SNF search  Lethargy- CVAs -  Awakening very little after OR on 1/26 - acute CVA noted -doubtful this was causing her lethargy but may have led to her initial fall which resulted in the fracture -- no infection noted on UA - obtained ammonia level which was normal  - was quite dehydrated and appeared to awaken after fluid bolus given and therefore, lethargy may all be due to dehydration- due to h/o CHF, I was not aggressively hydrating her during the post op period  Left post Cerebral, temporal and occipital lobe infarcts - obtained MRI which confirmed multifocal infarcts- difficult to tell if this happed while at home vs while off of the Xarelto in the hospital - infarcts possibly related to A-fib - on Heparin now- evaluate swallowing- PT/ OT consult- does not appear to have focal deficits in arms or right leg- may have visual deficits which are difficult to assess  Dehydration - based upon elevated specific gravity on UA, she is dehydrated- BUN Cr ratio not elevated - with alkalosis noted on ABG which may have been contraction alkalosis - bolused with 1 L NS and increased continuous fluids - f/u on 2 D ECHO to determine extent of CHF - monitoring I and O carefully  Acute hypoxic resp failure due to  b/l PE --noted  to be hypoxic after surgery - CXR did not reveal and etiology-  b/l PEs noted on CT  - h/o b/l PE in the past as well even while on Pradaxa- will obtain hypercoag panel - will not order Protein C, S, Anti-thrombin as these will be inaccurate in setting of acute PE and heparin use - Heparin started- will obtain venous duplex - check ECHO for strain- hemodynamically stable- no dyspnea or chest pain   CHF (congestive heart failure) (HCC):  - No 2-D echo on record, not sure which type of CHF.  - CHF is compensated.  -Hold her Lasix for now as she is compensated and she has minimal PO intake of fluid  Anemia - Hb dropped from 12 to 8.9 (due to acute blood loss from knee fracture?) given 1 U PRBC by ortho prior to surgery - Hb improved to about 9.8 and now in 8-9 range  HTN -  cont to hold Verapamil for now   Leukocytosis:  Likely due to stress-induced demargination. Patient does not have signs of infection and WBC count has normalized  Atrial Fibrillation:  CHA2DS2-VASc Score is 6- Patient is on Xarelto at home.  - resumed Xarelto on the day after surgery as recommended by Ortho but due to acute PE's heparin has been started    DM-II:  - Last A1c not on record. Patient is taking metformin and Januvia at home - SSI while here - A1c is 8.3  COPD:  - stable. -Continue Xopenex inhaler, Spiriva inhaler  HLD: - Last LDL was not on record- current LDL is 121 -Continue home medications: Pravastatin  GERD: -Protonix  Depression:  -Stable, no suicidal or homicidal ideations. -Continue home medications: Celexa    Antibiotics: Anti-infectives    Start     Dose/Rate Route Frequency Ordered Stop   10/17/15 0600  vancomycin (VANCOCIN) IVPB 750 mg/150 ml premix     750 mg 150 mL/hr over 60 Minutes Intravenous Every 24 hours 10/16/15 1411 10/17/15 0753   10/16/15 2000  vancomycin (VANCOCIN) IVPB 1000 mg/200 mL premix  Status:  Discontinued     1,000 mg 200 mL/hr over 60 Minutes  Intravenous Every 12 hours 10/16/15 1324 10/16/15 1411   10/16/15 0845  clindamycin (CLEOCIN) IVPB 600 mg     600 mg 100 mL/hr over 30 Minutes Intravenous  Once 10/16/15 0833 10/16/15 0756   10/15/15 0815  vancomycin (VANCOCIN) IVPB 1000 mg/200 mL premix     1,000 mg 200 mL/hr over 60 Minutes Intravenous To [facility] 10/15/15 0800 10/16/15 0816   10/14/15 1500  vancomycin (VANCOCIN) IVPB 1000 mg/200 mL premix     1,000 mg 200 mL/hr over 60 Minutes Intravenous Every 12 hours 10/14/15 0603 10/14/15 1811   10/14/15 0315  vancomycin (VANCOCIN) IVPB 1000 mg/200 mL premix     1,000 mg 200 mL/hr over 60 Minutes Intravenous  Once 10/14/15 0305 10/14/15 0416     Code Status:     Code Status Orders        Start     Ordered   10/14/15 0524  Do not attempt resuscitation (DNR)   Continuous    Question Answer Comment  In the event of cardiac or respiratory ARREST Do not call a "code blue"   In the event of cardiac or respiratory ARREST Do not perform Intubation, CPR, defibrillation or ACLS   In the event of cardiac or respiratory ARREST Use medication by any route, position, wound care, and other measures to relive pain and suffering. May use oxygen, suction and manual treatment of airway obstruction as needed for comfort.      10/14/15 0523    Code Status History    Date Active Date Inactive Code Status Order ID Comments User Context   10/14/2015  3:41 AM 10/14/2015  5:23 AM DNR [id number]  Lorretta Harp, MD ED    Advance Directive Documentation        Most Recent Value   Type of Advance Directive  Healthcare Power of Attorney, Living will   Pre-existing out of facility DNR order (yellow form or pink MOST form)     "MOST" Form in Place?       Family Communication: daughter at bedside Disposition Plan: will need SNF  DVT prophylaxis: Lovenox Consultants: ortho Procedures:     Objective: Filed Weights   10/14/15 0056 10/17/15 0404 10/18/15 0617  Weight: 67.132 kg (148 lb) 68.5  kg (151 lb 0.2 oz) 77.3 kg (170 lb 6.7 oz)    Intake/Output Summary (Last 24 hours) at 10/19/15 1238 Last data filed at 10/19/15 0800  Gross per 24 hour  Intake      0 ml  Output   1050 ml  Net  -1050 ml     Vitals Filed Vitals:   10/18/15 1532 10/18/15 2028 10/19/15 0238 10/19/15 0725  BP: 142/78 147/82 162/93 158/85  Pulse: 80 80 71 83  Temp: 98.5 F (36.9 C) 98.1 F (36.7 C) 97.6 F (36.4 C) 97.5 F (36.4 C)  TempSrc:  Axillary  Axillary Oral  Resp: 18 18 18 19   Height:      Weight:      SpO2: 93% 94% 93% 96%    Exam:  General:  Pt is sleepy but more communicative than yesterday  HEENT: No icterus, No thrush, oral mucosa moist  Cardiovascular: regular rate and rhythm, S1/S2 No murmur  Respiratory: clear to auscultation bilaterally   Abdomen: Soft, +Bowel sounds, non tender, non distended, no guarding  MSK: No cyanosis or clubbing- no pedal edema- left leg in splint    Data Reviewed: Basic Metabolic Panel:  Recent Labs Lab 10/15/15 0500 10/16/15 0528 10/17/15 0324 10/18/15 0449 10/19/15 1030  NA 135 137 135 137 139  K 3.9 3.7 3.9 3.9 3.5  CL 96* 104 102 103 107  CO2 25 29 26 27 25   GLUCOSE 201* 218* 235* 245* 200*  BUN 6 7 9 9 9   CREATININE 0.71 0.69 0.78 0.77 0.72  CALCIUM 8.0* 8.5* 8.1* 8.2* 7.8*   Liver Function Tests:  Recent Labs Lab 10/14/15 0052 10/14/15 0535  AST 17 18  ALT 11* 12*  ALKPHOS 66 54  BILITOT 0.4 0.7  PROT 6.5 5.7*  ALBUMIN 3.5 3.0*   No results for input(s): LIPASE, AMYLASE in the last 168 hours.  Recent Labs Lab 10/18/15 1738  AMMONIA 25   CBC:  Recent Labs Lab 10/15/15 0500 10/15/15 1819 10/16/15 0528 10/17/15 0324 10/18/15 0449 10/19/15 1030  WBC 7.9  --  8.0 10.2 10.2 9.5  HGB 8.9* 9.4* 9.8* 8.9* 8.6* 8.6*  HCT 29.4* 31.1* 32.1* 27.6* 27.3* 27.6*  MCV 76.8*  --  78.3 77.7* 80.1 80.7  PLT 268  --  223 206 210 269   Cardiac Enzymes: No results for input(s): CKTOTAL, CKMB, CKMBINDEX, TROPONINI  in the last 168 hours. BNP (last 3 results)  Recent Labs  10/14/15 0535  BNP 141.5*    ProBNP (last 3 results) No results for input(s): PROBNP in the last 8760 hours.  CBG:  Recent Labs Lab 10/18/15 1713 10/18/15 1715 10/18/15 2129 10/19/15 0720 10/19/15 1123  GLUCAP 238* 252* 170* 172* 177*    Recent Results (from the past 240 hour(s))  Surgical pcr screen     Status: None   Collection Time: 10/15/15 10:20 PM  Result Value Ref Range Status   MRSA, PCR NEGATIVE NEGATIVE Final   Staphylococcus aureus NEGATIVE NEGATIVE Final    Comment:        The Xpert SA Assay (FDA approved for NASAL specimens in patients over 29 years of age), is one component of a comprehensive surveillance program.  Test performance has been validated by [facility] for patients greater than or equal to 32 year old. It is not intended to diagnose infection nor to guide or monitor treatment.      Studies: Ct Head W Wo Contrast  10/18/2015  CLINICAL DATA:  Recent orthopedic surgery.  Confusion. EXAM: CT HEAD WITHOUT AND WITH CONTRAST TECHNIQUE: Contiguous axial images were obtained from the base of the skull through the vertex without and with intravenous contrast CONTRAST:  50mL OMNIPAQUE IOHEXOL 300 MG/ML  SOLN COMPARISON:  10/14/2015 FINDINGS: There is newly seen low density in the left cerebellum that could be a recent infarction. Cerebral hemispheres show atrophy and chronic small vessel disease but no sign of supratentorial acute infarction. No mass lesion, hemorrhage, hydrocephalus or extra-axial collection. No abnormal contrast enhancement. IMPRESSION: Brain atrophy. Newly seen low density in the left cerebellum that could represent an acute infarction.  Electronically Signed   By: Paulina Fusi M.D.   On: 10/18/2015 14:30   Ct Angio Chest Pe W/cm &/or Wo Cm  10/19/2015  CLINICAL DATA:  Shortness of breath. Possible pulmonary embolus. Recent left femur fracture. EXAM: CT ANGIOGRAPHY CHEST WITH  CONTRAST TECHNIQUE: Multidetector CT imaging of the chest was performed using the standard protocol during bolus administration of intravenous contrast. Multiplanar CT image reconstructions and MIPs were obtained to evaluate the vascular anatomy. CONTRAST:  70mL OMNIPAQUE IOHEXOL 350 MG/ML SOLN COMPARISON:  None. FINDINGS: Technically adequate study with good opacification of the central and segmental pulmonary arteries. Filling defects are demonstrated in both distal main pulmonary arteries, extending into bilateral upper and lower lobe pulmonary arteries. Examination is positive for acute pulmonary embolus. RV to LV ratio measures 2.83, suggesting possible right heart strain. The left atrium and right atrium are also dilated. Coronary artery calcifications. Esophagus is decompressed. No significant lymphadenopathy in the chest. Normal caliber thoracic aorta with scattered calcification. Evaluation of lungs is limited due to respiratory motion artifact. There is mild dependent change in the lung bases with small bilateral pleural effusions. Ingestion of focal ground-glass nodules in the left mid lung, measuring up to 7 mm diameter. These are likely to be inflammatory but consider follow-up in 3 months for further evaluation. Hazy mosaic pattern to the upper lungs may indicate edema. No pneumothorax. Included portions of the upper abdominal organs are grossly unremarkable. Degenerative changes throughout the spine. There is anterior compression of a mid thoracic vertebra. Age is indeterminate. Normal alignment of the thoracic spine with accentuated kyphosis at the level of the compressed vertebra. Review of the MIP images confirms the above findings. IMPRESSION: Examination is positive for bilateral central and segmental pulmonary emboli. Elevated RV to LV ratio suggest evidence of possible right heart strain. Right atrium and left atrial also enlarged. Small bilateral pleural effusions with possible pulmonary  edema. Ground-glass nodule in the left mid lung. Consider follow-up in 3 months for further evaluation. These results were called by telephone at the time of interpretation on 10/19/2015 at 1:30 am to the patient's nurse on [facility], Ulla Potash, who verbally acknowledged these results. Electronically Signed   By: Burman Nieves M.D.   On: 10/19/2015 01:32   Mr Brain Wo Contrast  10/18/2015  CLINICAL DATA:  Acute onset of mental status changes. Abnormal head CT suggesting cerebellar infarction. EXAM: MRI HEAD WITHOUT CONTRAST TECHNIQUE: Multiplanar, multiecho pulse sequences of the brain and surrounding structures were obtained without intravenous contrast. COMPARISON:  Head CT same day FINDINGS: Diffusion imaging shows acute infarction in the left cerebellum at the site of the CT hypodensity. In addition, there is infarction throughout the left posterior cerebral artery territory affecting the posterior medial temporal lobe and occipital lobe as well as focal involvement of lateral thalamus. There is also some acute infarction in the right occipital cortex. Acute infarction is present in the left parietal cortical and subcortical brain and in the posterior left frontal deep white matter. There is no detectable hemorrhage. No mass effect or shift. No hydrocephalus or extra-axial collection. IMPRESSION: Acute infarction in the left cerebellum as suggested by CT. Acute infarction throughout the left posterior cerebral artery territory affecting the posterior medial temporal lobe and occipital lobe with focal involvement of the lateral thalamus. Additional acute infarction in the left parietal cortical and subcortical brain. Additional sub cm infarction in the left posterior frontal deep white matter. Electronically Signed   By: Paulina Fusi M.D.   On: 10/18/2015  19:43   Dg Chest Port 1 View  10/19/2015  CLINICAL DATA:  Hypoxia EXAM: PORTABLE CHEST 1 VIEW COMPARISON:  10/17/2015 FINDINGS: Cardiomegaly. No overt edema.  Left basilar airspace opacity could reflect atelectasis or infiltrate, slightly improved since prior study. No visible effusions. No acute bony abnormality. IMPRESSION: Cardiomegaly. Left base atelectasis or infiltrate slightly improved. Electronically Signed   By: Charlett Nose M.D.   On: 10/19/2015 08:53    Scheduled Meds:  Scheduled Meds: .  stroke: mapping our early stages of recovery book   Does not apply Once  . citalopram  10 mg Oral Daily  . dorzolamide  1 drop Both Eyes BID   And  . timolol  1 drop Both Eyes BID  . feeding supplement (ENSURE ENLIVE)  237 mL Oral BID BM  . insulin aspart  0-15 Units Subcutaneous TID WC  . insulin aspart  0-5 Units Subcutaneous QHS  . pantoprazole  40 mg Oral Daily  . pravastatin  40 mg Oral q1800  . senna-docusate  2 tablet Oral QHS  . tiotropium  18 mcg Inhalation Daily   Continuous Infusions: . sodium chloride    . heparin 1,000 Units/hr (10/19/15 0700)    Time spent on care of this patient: 35 min   Carmino Ocain, MD 10/19/2015, 12:38 PM  LOS: 5 days   [facility] Office  [phone] Pager - Text Page per [email or web address] If 7PM-7AM, please contact night-coverage [email or web address]

## 2015-10-19 NOTE — Progress Notes (Addendum)
ANTICOAGULATION CONSULT NOTE - Initial Consult  Pharmacy Consult for heparin Indication: pulmonary embolus and stroke    Patient Measurements: Height:  (160 cm) Weight: 170 lb 6.7 oz (77.3 kg) IBW/kg (Calculated) : 52.4 Heparin Dosing Weight: 70kg  Vital Signs: Temp: 97.5 F (36.4 C) (01/29 0725) Temp Source: Oral (01/29 0725) BP: 158/85 mmHg (01/29 0725) Pulse Rate: 83 (01/29 0725)  Labs:  Recent Labs  10/17/15 0324 10/18/15 0449 10/19/15 1030 10/19/15 1105  HGB 8.9* 8.6* 8.6*  --   HCT 27.6* 27.3* 27.6*  --   PLT 206 210 269  --   APTT  --   --   --  59*  CREATININE 0.78 0.77 0.72  --     Estimated Creatinine Clearance: 45.1 mL/min (by C-G formula based on Cr of 0.72).  Assessment: 82yo female in unfortunate situation s/p stroke after surgery, now w/ PE confirmed on CT w/ signs of RHS, has been on Xarelto for secondary stroke prevention but probably not a good candidate for full-dose Xarelto for PE, to transition to heparin for now.  aPTT = 59 slightly subtherapeutic on 1000 units/hr  Goal of Therapy:  Heparin level 0.3-0.5 units/ml aPTT 66-84 seconds Monitor platelets by anticoagulation protocol: Yes   Plan:  Will increase heparin rate slightly to 1100 units/hr  Recheck aPTT at 2100  Bayard Hugger, PharmD, BCPS  Clinical Pharmacist  Pager: [phone]  10/19/2015,12:38 PM   Addendum: APTT is now therapeutic at 70 seconds. Continue heparin at 1100 units/hr and follow up heparin level/aPTT in AM. Last xarelto dose 1/28 @ 1054.   Louie Casa, PharmD, BCPS 10/19/2015, 9:02 PM

## 2015-10-19 NOTE — Progress Notes (Signed)
STROKE TEAM PROGRESS NOTE   HISTORY OF PRESENT ILLNESS [name] [name] is an 82 y.o. female hx of A fib, DM, HTN admitted 1/24 after a fall in which she suffered a left leg distal femur fracture, s/p ORIF left femur on 1/26. Neurology consulted after patient developed altered mental status with MRI brain showing multiple acute infarcts. Patient was on Xarelto at home which was stopped on admission for surgical intervention. Xarelto was restarted on 1/27.   MRI brain imaging reviewed, shows acute infarct in the left cerebellum, left PCA territory involving posterior medial temporal lobe and occipital lobe and multiple infarcts in left parietal lobe.   Date last known well: unclear Time last known well: unclear tPA Given: no, unclear LSW and on Xarelto Modified Rankin: Rankin Score=1   SUBJECTIVE (INTERVAL HISTORY) Daughter and grandson were present at bedside. Daughter stated that pt was active at home before the fall. resumed Xarelto after surgery. Overnight pt was found to have PE and started on heparin drip. Will need TCD bubble study to rule out PFO. Discussed the treatment plan and answered all questions.    OBJECTIVE Temp:  [97.6 F (36.4 C)-98.5 F (36.9 C)] 97.6 F (36.4 C) (01/29 0238) Pulse Rate:  [71-80] 71 (01/29 0238) Cardiac Rhythm:  [-] Normal sinus rhythm (01/29 0733) Resp:  [18] 18 (01/29 0238) BP: (142-162)/(78-93) 162/93 mmHg (01/29 0238) SpO2:  [93 %-94 %] 93 % (01/29 0238) FiO2 (%):  [40 %] 40 % (01/28 0904)  CBC:  Recent Labs Lab 10/17/15 0324 10/18/15 0449  WBC 10.2 10.2  HGB 8.9* 8.6*  HCT 27.6* 27.3*  MCV 77.7* 80.1  PLT 206 210    Basic Metabolic Panel:  Recent Labs Lab 10/17/15 0324 10/18/15 0449  NA 135 137  K 3.9 3.9  CL 102 103  CO2 26 27  GLUCOSE 235* 245*  BUN 9 9  CREATININE 0.78 0.77  CALCIUM 8.1* 8.2*    Lipid Panel:    Component Value Date/Time   CHOL 172 10/14/2015 0535   TRIG 61 10/14/2015 0535   HDL 39* 10/14/2015 0535    CHOLHDL 4.4 10/14/2015 0535   VLDL 12 10/14/2015 0535   LDLCALC 121* 10/14/2015 0535   HgbA1c:  Lab Results  Component Value Date   HGBA1C 8.3* 10/14/2015   Urine Drug Screen: No results found for: LABOPIA, COCAINSCRNUR, LABBENZ, AMPHETMU, THCU, LABBARB    IMAGING I have personally reviewed the radiological images below and agree with the radiology interpretations.  Ct Head W Wo Contrast 10/18/2015   Brain atrophy. Newly seen low density in the left cerebellum that could represent an acute infarction.   Ct Angio Chest Pe W/cm &/or Wo Cm 10/19/2015   Examination is positive for bilateral central and segmental pulmonary emboli. Elevated RV to LV ratio suggest evidence of possible right heart strain. Right atrium and left atrial also enlarged. Small bilateral pleural effusions with possible pulmonary edema. Ground-glass nodule in the left mid lung. Consider follow-up in 3 months for further evaluation.   Mr Brain Wo Contrast 10/18/2015   Acute infarction in the left cerebellum as suggested by CT.  Acute infarction throughout the left posterior cerebral artery territory affecting the posterior medial temporal lobe and occipital lobe with focal involvement of the lateral thalamus.  Additional acute infarction in the left parietal cortical and subcortical brain.  Additional sub cm infarction in the left posterior frontal deep white matter.   CUS - pending  LE venous doppler - pending  TCD  bubble study - pending  TTE - pending   PHYSICAL EXAM  Temp:  [97.5 F (36.4 C)-98.1 F (36.7 C)] 97.5 F (36.4 C) (01/29 0725) Pulse Rate:  [71-84] 84 (01/29 1200) Resp:  [17-19] 17 (01/29 1200) BP: (135-162)/(69-93) 135/69 mmHg (01/29 1200) SpO2:  [90 %-97 %] 97 % (01/29 1323)  General - Well nourished, well developed, in no apparent distress.  Ophthalmologic - Fundi not visualized due to noncooperation.  Cardiovascular - Regular rate and rhythm.  Mental Status -  Level of arousal  and orientation to place, and person were intact, but not to time. Language including expression, naming, repetition, comprehension was assessed and found intact, however slow in reaction and moderate dysarthria. Fund of Knowledge was assessed and was impaired.  Cranial Nerves II - XII - II - right homonymous hemianopia. III, IV, VI - Extraocular movements intact. V - Facial sensation intact bilaterally. VII - right nasolabial fold falttening. VIII - Hearing & vestibular intact bilaterally. X - Palate elevates symmetrically, moderate dysarthria. XI - Chin turning & shoulder shrug intact bilaterally. XII - Tongue protrusion intact.  Motor Strength - The patient's strength 0/5 RUE and 3/5 RLE, and 5/5 LUE with spontaneous movement, LLE difficult movement proximally but distal toe dorsiflexion at least 4/5.  Bulk was normal and fasciculations were absent.   Motor Tone - Muscle tone was assessed at the neck and appendages and was normal.  Reflexes - The patient's reflexes were 1+ in all extremities and she had no pathological reflexes.  Sensory - Light touch, temperature/pinprick were assessed and were decreased on the right.    Coordination - not cooperative on exam.  Tremor was absent.  Gait and Station - not tested.   ASSESSMENT/PLAN [name] is a 82 y.o. female with history of A fib on Xarelto, DM, COPD, CHF, hyperlipidemia, HTN, and recent fall with left femur fracture status post ORIF 10/16/2015 (Xarelto held for surgery but restarted 10/17/15) presenting with altered mental status. She did not receive IV t-PA due to recent surgery, Xarelto therapy, and unknown time of onset.  Strokes:  Multifocal infarcts, largely involving left PCA territory, but also left cerebellar, right PCA, left MCA, felt to be embolic secondary to atrial fibrillation vs. paradoxical emboli.  Resultant  Right hemianopia, right hemiparesis  MRI - multiple acute infarctions as described  above  Carotid Doppler - pending  2D Echo - pending  TCD bubble study - pending  Lower extremity duplex - pending  LDL - 121  HgbA1c pending  VTE prophylaxis - IV heparin / SCDs  Diet NPO time specified  Xarelto (rivaroxaban) daily prior to admission, now on heparin IV  Ongoing aggressive stroke risk factor management  Therapy recommendations: Pending  Disposition: Pending  PE  Most likely due to DVT s/p LE surgery  Venous doppler pending  On heparin drip  TTE pending to evaluate right heart function  Hypertension  Stable  Permissive hypertension (OK if <180/105) but gradually normalize in 5-7 days  Hyperlipidemia  Home meds:  Pravachol 40 mg daily resumed in hospital  LDL 121, goal < 70  Change to Lipitor 40  Continue statin at discharge  Diabetes  HgbA1c pending, goal < 7.0  Unc / Controlled  Other Stroke Risk Factors  Advanced age  Family history of strokes  History of PE with previous LE surgeries.  Other Active Problems    Hospital day # 5  Marvel Plan, MD PhD Stroke Neurology 10/19/2015 5:43 PM  To contact Stroke Continuity provider, please refer to [email or web address] After hours, contact General Neurology

## 2015-10-19 NOTE — Progress Notes (Signed)
Orthopedics Progress Note  Subjective: Patient doing much better this morning according to the family. Patient tells me she wants to go  home  Objective:  Filed Vitals:   10/19/15 0238 10/19/15 0725  BP: 162/93 158/85  Pulse: 71 83  Temp: 97.6 F (36.4 C) 97.5 F (36.4 C)  Resp: 18 19    General: Awake and alert  Musculoskeletal: left leg dressings changed. Compartments supple. No pain with log roll and AROM ankle. Neurovascularly intact  Lab Results  Component Value Date   WBC 9.5 10/19/2015   HGB 8.6* 10/19/2015   HCT 27.6* 10/19/2015   MCV 80.7 10/19/2015   PLT 269 10/19/2015       Component Value Date/Time   NA 137 10/18/2015 0449   K 3.9 10/18/2015 0449   CL 103 10/18/2015 0449   CO2 27 10/18/2015 0449   GLUCOSE 245* 10/18/2015 0449   BUN 9 10/18/2015 0449   CREATININE 0.77 10/18/2015 0449   CALCIUM 8.2* 10/18/2015 0449   GFRNONAA >60 10/18/2015 0449   GFRAA >60 10/18/2015 0449    Lab Results  Component Value Date   INR 1.11 10/14/2015    Assessment/Plan: POD #3 s/p Procedure(s): OPEN REDUCTION INTERNAL FIXATION (ORIF) DISTAL FEMUR FRACTURE Patient transferred to stepdown after work up yesterday revealed an acute stoke and also bilateral PEs Clinically the patient looks remarkably improved today Will follow  Almedia Balls. Ranell Patrick, MD 10/19/2015 11:12 AM

## 2015-10-19 NOTE — Evaluation (Signed)
Clinical/Bedside Swallow Evaluation Patient Details  Name: [name] [name] MRN: [id number] Date of Birth: Aug 27, 1922  Today's Date: 10/19/2015 Time: SLP Start Time (ACUTE ONLY): 1100 SLP Stop Time (ACUTE ONLY): 1123 SLP Time Calculation (min) (ACUTE ONLY): 23 min  Past Medical History:  Past Medical History  Diagnosis Date  . Hypertension   . Diabetes mellitus without complication (HCC)   . COPD (chronic obstructive pulmonary disease) (HCC)   . Atrial fibrillation (HCC)   . Glaucoma     Bilaterally  . CHF (congestive heart failure) (HCC)   . HLD (hyperlipidemia)   . GERD (gastroesophageal reflux disease)   . Depression    Past Surgical History:  Past Surgical History  Procedure Laterality Date  . Cardiac catheterization  2003  . Replacement total knee bilateral Bilateral   . I&d extremity Left 10/14/2015    Procedure: IRRIGATION AND DEBRIDEMENT LEFT LEG;  Surgeon: Venita Lick, MD;  Location: [facility] OR;  Service: Orthopedics;  Laterality: Left;  . Orif femur fracture Left 10/16/2015    Procedure: OPEN REDUCTION INTERNAL FIXATION (ORIF) DISTAL FEMUR FRACTURE;  Surgeon: Samson Frederic, MD;  Location: [facility] OR;  Service: Orthopedics;  Laterality: Left;   HPI:  82 y.o. female hx of A fib, DM, HTN admitted 1/24 after a fall in which she suffered a left leg distal femur fracture, s/p ORIF left femur on 1/26. AMS noted after procedure, prompting an MRI that showed acute infarct in the left cerebellum, left PCA territory involving posterior medial temporal lobe and occipital lobe and multiple infarcts in left parietal lobe. Daughter says that pt coughs frequently on food and liquids PTA, and that this has been a more chronic problem. She also says pt has a h/o reflux.   Assessment / Plan / Recommendation Clinical Impression  Pt has oral holding with all consistencies, requiring Mod cues from SLP to clear. Per family, this is baseline for her, as is prolonged oral preparation and coughing with both  solids and liquids. Immediate throat clearing and delayed coughing is noted after straw sips of thin liquids and bites of Dys 2 textures. Family is eager for her to eat/drink today. Recommend starting Dys 1 diet and thin liquids by cup, allowing pt to self-feed. Will f/u to assess for diet tolerance and readiness to advance.    Aspiration Risk  Mild aspiration risk;Moderate aspiration risk    Diet Recommendation Dysphagia 1 (Puree);Thin liquid   Liquid Administration via: Cup;No straw Medication Administration: Crushed with puree Supervision: Patient able to self feed;Full supervision/cueing for compensatory strategies Compensations: Minimize environmental distractions;Slow rate;Small sips/bites Postural Changes: Seated upright at 90 degrees;Remain upright for at least 30 minutes after po intake    Other  Recommendations Oral Care Recommendations: Oral care BID   Follow up Recommendations  Skilled Nursing facility    Frequency and Duration min 2x/week  2 weeks       Prognosis Prognosis for Safe Diet Advancement: Fair Barriers to Reach Goals: Other (Comment) (appears to have baseline dysphagia)      Swallow Study   General HPI: 82 y.o. female hx of A fib, DM, HTN admitted 1/24 after a fall in which she suffered a left leg distal femur fracture, s/p ORIF left femur on 1/26. AMS noted after procedure, prompting an MRI that showed acute infarct in the left cerebellum, left PCA territory involving posterior medial temporal lobe and occipital lobe and multiple infarcts in left parietal lobe. Daughter says that pt coughs frequently on food and liquids PTA, and  that this has been a more chronic problem. She also says pt has a h/o reflux. Type of Study: Bedside Swallow Evaluation Previous Swallow Assessment: none in chart Diet Prior to this Study: NPO Temperature Spikes Noted: No Respiratory Status: Nasal cannula History of Recent Intubation: Yes Length of Intubations (days):  (for  procedure) Behavior/Cognition: Alert;Cooperative;Confused;Requires cueing Oral Cavity Assessment: Within Functional Limits Oral Care Completed by SLP: No Oral Cavity - Dentition: Adequate natural dentition Vision: Impaired for self-feeding Self-Feeding Abilities: Able to feed self;Needs assist Patient Positioning: Upright in bed Baseline Vocal Quality: Normal Volitional Cough: Strong Volitional Swallow: Able to elicit    Oral/Motor/Sensory Function Overall Oral Motor/Sensory Function: Within functional limits   Ice Chips Ice chips: Impaired Presentation: Spoon Oral Phase Functional Implications: Oral holding   Thin Liquid Thin Liquid: Impaired Presentation: Cup;Self Fed;Straw Oral Phase Functional Implications: Oral holding Pharyngeal  Phase Impairments: Throat Clearing - Immediate;Cough - Delayed    Nectar Thick Nectar Thick Liquid: Not tested   Honey Thick Honey Thick Liquid: Not tested   Puree Puree: Impaired Presentation: Self Fed;Spoon Oral Phase Functional Implications: Oral holding   Solid   GO   Solid: Impaired Presentation: Spoon Oral Phase Functional Implications: Impaired mastication;Oral holding Pharyngeal Phase Impairments: Throat Clearing - Immediate       Maxcine Ham, M.A. CCC-SLP [phone]  Maxcine Ham 10/19/2015,11:34 AM

## 2015-10-20 ENCOUNTER — Inpatient Hospital Stay (HOSPITAL_COMMUNITY): Payer: Medicare Other

## 2015-10-20 DIAGNOSIS — I639 Cerebral infarction, unspecified: Secondary | ICD-10-CM

## 2015-10-20 DIAGNOSIS — S72402A Unspecified fracture of lower end of left femur, initial encounter for closed fracture: Secondary | ICD-10-CM

## 2015-10-20 DIAGNOSIS — S7292XB Unspecified fracture of left femur, initial encounter for open fracture type I or II: Secondary | ICD-10-CM

## 2015-10-20 DIAGNOSIS — I2699 Other pulmonary embolism without acute cor pulmonale: Secondary | ICD-10-CM

## 2015-10-20 LAB — GLUCOSE, CAPILLARY
GLUCOSE-CAPILLARY: 186 mg/dL — AB (ref 65–99)
GLUCOSE-CAPILLARY: 185 mg/dL — AB (ref 65–99)
GLUCOSE-CAPILLARY: 169 mg/dL — AB (ref 65–99)
Glucose-Capillary: 173 mg/dL — ABNORMAL HIGH (ref 65–99)

## 2015-10-20 LAB — CBC
HEMATOCRIT: 28.3 % — AB (ref 36.0–46.0)
HEMOGLOBIN: 8.5 g/dL — AB (ref 12.0–15.0)
MCH: 24.3 pg — ABNORMAL LOW (ref 26.0–34.0)
MCHC: 30 g/dL (ref 30.0–36.0)
MCV: 80.9 fL (ref 78.0–100.0)
Platelets: 306 10*3/uL (ref 150–400)
RBC: 3.5 MIL/uL — ABNORMAL LOW (ref 3.87–5.11)
RDW: 19.6 % — AB (ref 11.5–15.5)
WBC: 10.2 10*3/uL (ref 4.0–10.5)

## 2015-10-20 LAB — HEMOGLOBIN A1C
Hgb A1c MFr Bld: 7.6 % — ABNORMAL HIGH (ref 4.8–5.6)
Mean Plasma Glucose: 171 mg/dL

## 2015-10-20 LAB — HEPARIN LEVEL (UNFRACTIONATED): HEPARIN UNFRACTIONATED: 0.63 [IU]/mL (ref 0.30–0.70)

## 2015-10-20 LAB — APTT: APTT: 69 s — AB (ref 24–37)

## 2015-10-20 MED ORDER — CETYLPYRIDINIUM CHLORIDE 0.05 % MT LIQD
7.0000 mL | Freq: Two times a day (BID) | OROMUCOSAL | Status: DC
  Administered 2015-10-20 – 2015-10-23 (×6): 7 mL via OROMUCOSAL

## 2015-10-20 MED ORDER — VERAPAMIL HCL 40 MG PO TABS
20.0000 mg | ORAL_TABLET | Freq: Two times a day (BID) | ORAL | Status: DC
  Administered 2015-10-20 – 2015-10-21 (×3): 20 mg via ORAL
  Filled 2015-10-20 (×4): qty 1

## 2015-10-20 MED ORDER — SODIUM CHLORIDE 0.45 % IV SOLN
INTRAVENOUS | Status: DC
  Administered 2015-10-20: 22:00:00 via INTRAVENOUS
  Administered 2015-10-20: 50 mL/h via INTRAVENOUS

## 2015-10-20 MED ORDER — MEGESTROL ACETATE 400 MG/10ML PO SUSP
400.0000 mg | Freq: Two times a day (BID) | ORAL | Status: DC
  Administered 2015-10-20 – 2015-10-21 (×2): 400 mg via ORAL
  Filled 2015-10-20 (×3): qty 10

## 2015-10-20 MED ORDER — WHITE PETROLATUM GEL
Status: AC
  Administered 2015-10-20: 0.2
  Filled 2015-10-20: qty 1

## 2015-10-20 NOTE — Progress Notes (Signed)
VASCULAR LAB PRELIMINARY  PRELIMINARY  PRELIMINARY  PRELIMINARY   TCD completed.    TCD Bubble study completed:  No HITS noted.   Zariyah Stephens, RVT 10/20/2015, 4:02 PM

## 2015-10-20 NOTE — Progress Notes (Signed)
Attempted to do NIH on patient, pt to lethargic to cooperate wakes up and talks but goes back to sleep. Attempted multiple times without success.  Per primary nurse pt    Has times of lethargy mixed with acute awareness will continue to monitor

## 2015-10-20 NOTE — Progress Notes (Signed)
STROKE TEAM PROGRESS NOTE   SUBJECTIVE (INTERVAL HISTORY) Her daughter is at the bedside. Carotids and LE dopplers just completed showed left LE DVT which is able to explain the PE. TCD also done showed no RTLS at rest, valsalva maneuver is not effective. Pt still on heparin drip, right UE and LE much improved muscle strength but still has dense right hemianopia. PT/OT on board.    OBJECTIVE Temp:  [97.7 F (36.5 C)-98.4 F (36.9 C)] 97.7 F (36.5 C) (01/30 0800) Pulse Rate:  [58-92] 90 (01/30 0800) Cardiac Rhythm:  [-] Normal sinus rhythm (01/30 0741) Resp:  [17-24] 23 (01/30 0800) BP: (135-167)/(69-110) 162/110 mmHg (01/30 0800) SpO2:  [90 %-97 %] 94 % (01/30 0942)  CBC:   Recent Labs Lab 10/19/15 1030 10/20/15 0315  WBC 9.5 10.2  HGB 8.6* 8.5*  HCT 27.6* 28.3*  MCV 80.7 80.9  PLT 269 306    Basic Metabolic Panel:   Recent Labs Lab 10/18/15 0449 10/19/15 1030  NA 137 139  K 3.9 3.5  CL 103 107  CO2 27 25  GLUCOSE 245* 200*  BUN 9 9  CREATININE 0.77 0.72  CALCIUM 8.2* 7.8*    Lipid Panel:     Component Value Date/Time   CHOL 114 10/19/2015 0730   TRIG 142 10/19/2015 0730   HDL 27* 10/19/2015 0730   CHOLHDL 4.2 10/19/2015 0730   VLDL 28 10/19/2015 0730   LDLCALC 59 10/19/2015 0730   HgbA1c:  Lab Results  Component Value Date   HGBA1C 7.6* 10/19/2015   Urine Drug Screen: No results found for: LABOPIA, COCAINSCRNUR, LABBENZ, AMPHETMU, THCU, LABBARB    IMAGING I have personally reviewed the radiological images below and agree with the radiology interpretations.  Ct Head W Wo Contrast 10/18/2015   Brain atrophy. Newly seen low density in the left cerebellum that could represent an acute infarction.   Ct Angio Chest Pe W/cm &/or Wo Cm 10/19/2015   Examination is positive for bilateral central and segmental pulmonary emboli. Elevated RV to LV ratio suggest evidence of possible right heart strain. Right atrium and left atrial also enlarged. Small  bilateral pleural effusions with possible pulmonary edema. Ground-glass nodule in the left mid lung. Consider follow-up in 3 months for further evaluation.   Mr Brain Wo Contrast 10/18/2015   Acute infarction in the left cerebellum as suggested by CT.  Acute infarction throughout the left posterior cerebral artery territory affecting the posterior medial temporal lobe and occipital lobe with focal involvement of the lateral thalamus.  Additional acute infarction in the left parietal cortical and subcortical brain.  Additional sub cm infarction in the left posterior frontal deep white matter.   Carotid ultrasound - Bilateral: 1-39% ICA stenosis. Vertebral artery flow is antegrade.   LE venous doppler - Right: No evidence of DVT, superficial thrombosis, or Baker's cyst. Left: DVT noted in the PTV and peroneal v. No evidence of superficial thrombosis. No Baker's cyst  TCD bubble study - negative for right to left shunt at rest, Valsalva not adequate or effective.   TTE - pending   PHYSICAL EXAM General - Well nourished, well developed, in no apparent distress.  Ophthalmologic - Fundi not visualized due to noncooperation.  Cardiovascular - Regular rate and rhythm.  Mental Status -  Level of arousal and orientation to place, and person were intact, but not to time. Language including expression, naming, repetition, comprehension was assessed and found intact, however slow in reaction and moderate dysarthria. Fund of Knowledge was  assessed and was impaired.  Cranial Nerves II - XII - II - right homonymous hemianopia. III, IV, VI - Extraocular movements intact. V - Facial sensation intact bilaterally. VII - facial symmetrical. VIII - Hearing & vestibular intact bilaterally. X - Palate elevates symmetrically, moderate dysarthria. XI - Chin turning & shoulder shrug intact bilaterally. XII - Tongue protrusion intact.  Motor Strength - The patient's strength 4+/5 RUE and 4/5 RLE,  and 5/5 LUE with spontaneous movement, LLE difficult movement proximally but distal toe dorsiflexion at least 4/5.  Bulk was normal and fasciculations were absent.   Motor Tone - Muscle tone was assessed at the neck and appendages and was normal.  Reflexes - The patient's reflexes were 1+ in all extremities and she had no pathological reflexes.  Sensory - Light touch, temperature/pinprick were assessed and were decreased on the right.    Coordination - not cooperative on exam.  Tremor was absent.  Gait and Station - not tested.   ASSESSMENT/PLAN [name] is a 82 y.o. female with history of A fib on Xarelto, DM, COPD, CHF, hyperlipidemia, HTN, and recent fall with left femur fracture status post ORIF 10/16/2015 (Xarelto held for surgery but restarted 10/17/15) presenting with altered mental status. She did not receive IV t-PA due to recent surgery, Xarelto therapy, and unknown time of onset.  Strokes:  Multifocal infarcts, largely involving left PCA territory, but also left cerebellar, right PCA, left MCA, felt to be embolic likely secondary to atrial fibrillation  Resultant  Right hemianopia  MRI - multiple acute infarctions as described above  Carotid Doppler - No significant stenosis   2D Echo - pending  TCD bubble study - negative for right to left shunt at rest, Valsalva not adequate  Lower extremity duplex - DVT in PTV and peroneal vein  LDL - 121  HgbA1c 8.3  VTE prophylaxis - IV heparin / SCDs DIET - DYS 1 Room service appropriate?: Yes; Fluid consistency:: Thin  Xarelto (rivaroxaban) daily prior to admission, now on heparin IV. Recommend to switch to PO NOAC whenever appropriate.   Ongoing aggressive stroke risk factor management  Therapy recommendations: SNF  Disposition: Pending  Atrial Fibrillation  Home anticoagulation:  xarelto  CHA2DS2-VASc Score = 6, ?2 oral anticoagulation recommended  Now on IV heparin.  Consider switch to po NOACs whenever  appropriate.   PE  Most likely due to DVT s/p LE surgery  Venous doppler showed LLE DVT  On heparin drip  Consider to switch to po NOACs whenever appropriate  TTE pending to evaluate right heart function  Hypertension  Stable  Permissive hypertension (OK if <180/105) but gradually normalize in 5-7 days  Hyperlipidemia  Home meds:  Pravachol 40 mg daily resumed in hospital  LDL 121, goal < 70  Change to Lipitor 40  Continue statin at discharge  Diabetes  HgbA1c 8.3, goal < 7.0  Uncontrolled  SSI  Need close follow up with PCP  Other Stroke Risk Factors  Advanced age  Family history of strokes  History of PE with previous LE surgeries - hypercoagulable work up pending  Other Active Problems  L femur fx s/p ORIF 1/26  Acute hypoxic respiratory failure d/t Bilat PE  CHF  Anemia  Leukocytosis  COPD  GERD  depression  Hospital day # 6  Neurology will sign off. Please call with questions. Pt will follow up with Dr. Roda Shutters at [facility] in about 2 months. Thanks for the consult.  Marvel Plan, MD PhD Stroke  Neurology 10/20/2015 5:52 PM   To contact Stroke Continuity provider, please refer to [email or web address]. After hours, contact General Neurology

## 2015-10-20 NOTE — Progress Notes (Signed)
Dr. Butler Denmark paged for second time.  No response to this point from first page at approximately 08:20.

## 2015-10-20 NOTE — Progress Notes (Signed)
VASCULAR LAB PRELIMINARY  PRELIMINARY  PRELIMINARY  PRELIMINARY  Bilateral lower extremity venous duplex and Carotid duplex  completed.    Preliminary report:    Venous:  Right:  No evidence of DVT, superficial thrombosis, or Baker's cyst.  Left: DVT noted in the PTV and peroneal v.  No evidence of superficial thrombosis.  No Baker's cyst.    Carotid:  Bilateral:  1-39% ICA stenosis.  Vertebral artery flow is antegrade.      Jaspreet Hollings, RVT 10/20/2015, 10:48 AM

## 2015-10-20 NOTE — Progress Notes (Signed)
ANTICOAGULATION CONSULT NOTE - Follow up Consult  Pharmacy Consult for heparin Indication: pulmonary embolus and stroke    Patient Measurements: Height:  (160 cm) Weight: 170 lb 6.7 oz (77.3 kg) IBW/kg (Calculated) : 52.4 Heparin Dosing Weight: 70kg  Vital Signs: Temp: 97.7 F (36.5 C) (01/30 0800) Temp Source: Axillary (01/30 0800) BP: 162/110 mmHg (01/30 0800) Pulse Rate: 90 (01/30 0800)  Labs:  Recent Labs  10/18/15 0449 10/19/15 1030 10/19/15 1105 10/19/15 1200 10/19/15 1930 10/20/15 0315  HGB 8.6* 8.6*  --   --   --  8.5*  HCT 27.3* 27.6*  --   --   --  28.3*  PLT 210 269  --   --   --  306  APTT  --   --  59*  --  70* 69*  HEPARINUNFRC  --   --   --  0.98*  --  0.63  CREATININE 0.77 0.72  --   --   --   --     Estimated Creatinine Clearance: 45.1 mL/min (by C-G formula based on Cr of 0.72).  Assessment: 82yo female in unfortunate situation s/p stroke after surgery, now w/ PE confirmed on CT w/ signs of RHS, has been on Xarelto for secondary stroke prevention but probably not a good candidate for full-dose Xarelto for PE, to transition to heparin for now. Last xarelto dose 1/28 @ 1054.  aPTT = 70> 69 therapeutic x 2 on 1100 units/hr  Goal of Therapy:  Heparin level 0.3-0.5 units/ml aPTT 66-84 seconds Monitor platelets by anticoagulation protocol: Yes   Plan:  Will continue heparin at 1100 units/hr  Recheck aPTT/Heparin level in am Daily heparin level/aPTT, CBC   Thank you for allowing [place] to participate in this patients care. Signe Colt, PharmD  10/20/2015,11:12 AM

## 2015-10-20 NOTE — Progress Notes (Signed)
Physical Therapy Treatment/Re-evaluation Patient Details Name: [name] [name] MRN: [id number] DOB: [birth year] Today's Date: 10/20/2015    History of Present Illness Pt admitted after fall with L femur fx s/p IM nail.  Pt presented w/ AMS during hospital stay and imaging revealed Lt PCA, Rt PCA, and Lt MCA infarcts. PMHx: Afib, HTN, COPD, DM, s/p bilateral knee replacement , GERD, depression, congestive heart failure    PT Comments    Pt admitted with new additional above diagnosis. Pt currently with functional limitations due to the deficits listed below (see PT Problem List). [name] [name] remains lethargic but is able to answer some questions w/ increased time.  She tolerated sitting EOB x15 minutes w/ min guard>mod assist to maintain upright. Pt will benefit from skilled PT to increase their independence and safety with mobility to allow discharge to the venue listed below.     Follow Up Recommendations  SNF;Supervision/Assistance - 24 hour     Equipment Recommendations  Wheelchair (measurements PT);Wheelchair cushion (measurements PT)    Recommendations for Other Services       Precautions / Restrictions Precautions Precautions: Fall Required Braces or Orthoses: Knee Immobilizer - Left Knee Immobilizer - Left: On at all times Restrictions Weight Bearing Restrictions: Yes LLE Weight Bearing: Non weight bearing    Mobility  Bed Mobility Overal bed mobility: Needs Assistance;+2 for physical assistance Bed Mobility: Supine to Sit;Sit to Supine     Supine to sit: Total assist;+2 for physical assistance Sit to supine: Total assist;+2 for physical assistance   General bed mobility comments: Pt initiates supine>sit but requires total assist +2 w/ support provided to trunk and assist managing Bil LEs w/ use of bed pad for positioning for supine<>sit  Transfers                 General transfer comment: unable to assess at this time  Ambulation/Gait                  Stairs            Wheelchair Mobility    Modified Rankin (Stroke Patients Only) Modified Rankin (Stroke Patients Only) Pre-Morbid Rankin Score: No symptoms Modified Rankin: Severe disability     Balance Overall balance assessment: Needs assistance Sitting-balance support: Bilateral upper extremity supported;Feet unsupported Sitting balance-Leahy Scale: Poor Sitting balance - Comments: Pt able to tolerate sitting EOB ~15 minutes.  Close min guard>intermittent mod assist.   Postural control: Posterior lean                          Cognition Arousal/Alertness: Lethargic Behavior During Therapy: Flat affect Overall Cognitive Status: No family/caregiver present to determine baseline cognitive functioning Area of Impairment: Attention;Orientation;Following commands;Safety/judgement;Awareness;Problem solving Orientation Level: Disoriented to;Situation Current Attention Level: Focused   Following Commands: Follows one step commands inconsistently;Follows one step commands with increased time Safety/Judgement: Decreased awareness of safety;Decreased awareness of deficits Awareness: Intellectual Problem Solving: Slow processing;Difficulty sequencing;Requires verbal cues;Requires tactile cues      Exercises General Exercises - Lower Extremity Ankle Circles/Pumps: PROM;AROM;Left;Right;10 reps;Supine Long Arc Quad: AROM;Right;10 reps;Seated Heel Slides: AROM;AAROM;Right;10 reps;Supine    General Comments General comments (skin integrity, edema, etc.): BP supine in bed at start of session: 149/89, BP in sitting EOB 158/87.  Rt LE strength grossly 2/5.       Pertinent Vitals/Pain Pain Assessment: Faces Faces Pain Scale: Hurts little more Pain Location: points to Bil LEs Pain Descriptors / Indicators: Grimacing Pain Intervention(s): Limited activity  within patient's tolerance;Monitored during session;Repositioned    Home Living                       Prior Function            PT Goals (current goals can now be found in the care plan section) Acute Rehab PT Goals Patient Stated Goal: none stated PT Goal Formulation: With family Time For Goal Achievement: 11/10/15 Potential to Achieve Goals: Fair Progress towards PT goals: Not progressing toward goals - comment (functional decline and strength following stroke)    Frequency  Min 2X/week    PT Plan Frequency needs to be updated    Co-evaluation             End of Session Equipment Utilized During Treatment: Left knee immobilizer Activity Tolerance: Patient limited by lethargy;Patient limited by fatigue Patient left: in bed;with call bell/phone within reach;with bed alarm set     Time: 1311-1330 PT Time Calculation (min) (ACUTE ONLY): 19 min  Charges:                       G Codes:      Michail Jewels PT, DPT [phone] Pager: [phone] 10/20/2015, 3:52 PM

## 2015-10-20 NOTE — Progress Notes (Signed)
OT Cancellation Note  Patient Details Name: [name] [name] MRN: [id number] DOB: [birth year]   Cancelled Treatment:    Reason Eval/Treat Not Completed: Medical issues which prohibited therapy (elevated BP) RN Cordelia Pen requesting therapy hold this AM due to elevated BP. Rn contacting MD regarding medication management for BP. OT to check back at the next most appropriate time.  Felecia Shelling   OTR/L Pager: [phone] Office: [phone] .  10/20/2015, 8:11 AM

## 2015-10-20 NOTE — Progress Notes (Signed)
[facility] Progress Note   [name] [name]  ZOX:[id number]  DOB: 04-07-1922  DOA: 10/14/2015 PCP: [name], MD  Brief narrative: [name] [name] is a 82 y.o. female PMH of af-fib on Xarelto, HTN, COPD, DM, s/p bilateral knee replacement by [name] 5-7 years ago, GERD, depression, congestive heart failure, who presents with left knee pain after fall. She is found to have a periprothetic knee fracture   Subjective: Continues to be communicative. PO intake still quite poor.   Assessment/Plan: Open periprosthetic femur fracture, left - went to OR for washing on 1/24 and then underwent ORIF on 1/26 - management per ortho-  Non-weight bearing- SNF search  Lethargy- CVAs -  Awakening very little after OR on 1/26 - acute CVA noted -doubtful this was causing her lethargy but may have led to her initial fall which resulted in the fracture -- no infection noted on UA - obtained ammonia level which was normal  - was quite dehydrated and appeared to awaken after fluid bolus given and therefore, lethargy may all be due to dehydration   Left post Cerebral, temporal and occipital lobe infarcts - obtained MRI which confirmed multifocal infarcts- difficult to tell if this happened while at home vs while off of the Xarelto in the hospital - infarcts possibly related to A-fib - on Heparin now- evaluate swallowing- PT/ OT consult- does not appear to have focal deficits in arms or right leg- has right hemianopia  - await ECHO to r/o PFO  Dehydration - based upon elevated specific gravity on UA, she is dehydrated- BUN Cr ratio not elevated - with alkalosis noted on ABG which may have been contraction alkalosis - bolused with 1 L NS and increased continuous fluids - f/u on 2 D ECHO to determine extent of CHF - monitoring I and O carefully  Acute hypoxic resp failure due to  b/l PE --noted to be hypoxic after surgery - CXR did not reveal and etiology-  b/l PEs noted on CT  - h/o b/l PE in the  past as well even while on Pradaxa- will obtain hypercoag panel - will not order Protein C, S, Anti-thrombin as these will be inaccurate in setting of acute PE and heparin use - Heparin started -  venous duplex reveals left leg DVT - check ECHO for strain- hemodynamically stable- no dyspnea or chest pain   CHF (congestive heart failure) (HCC):  - No 2-D echo on record, not sure which type of CHF.  - CHF is compensated.  -Hold her Lasix for now as she is compensated and she has minimal PO intake of fluid  Anemia - Hb dropped from 12 to 8.9 (due to acute blood loss from knee fracture?) given 1 U PRBC by ortho prior to surgery - Hb improved to about 9.8 and now in 8-9 range  HTN -  Will resume half dose of Verapamil today  Leukocytosis:  Likely due to stress-induced demargination. Patient does not have signs of infection and WBC count has normalized  Atrial Fibrillation:  CHA2DS2-VASc Score is 6- Patient is on Xarelto at home.  - resumed Xarelto on the day after surgery as recommended by Ortho but due to acute PE's heparin has been started    DM-II:  - Last A1c not on record. Patient is taking metformin and Januvia at home - SSI while here - A1c is 8.3  COPD:  - stable. -Continue Xopenex inhaler, Spiriva inhaler  HLD: - Last LDL was not on record- current LDL  is 121 -Continue home medications: Pravastatin  GERD: -Protonix  Depression:  -Stable, no suicidal or homicidal ideations. -Continue home medications: Celexa    Antibiotics: Anti-infectives    Start     Dose/Rate Route Frequency Ordered Stop   10/17/15 0600  vancomycin (VANCOCIN) IVPB 750 mg/150 ml premix     750 mg 150 mL/hr over 60 Minutes Intravenous Every 24 hours 10/16/15 1411 10/17/15 0753   10/16/15 2000  vancomycin (VANCOCIN) IVPB 1000 mg/200 mL premix  Status:  Discontinued     1,000 mg 200 mL/hr over 60 Minutes Intravenous Every 12 hours 10/16/15 1324 10/16/15 1411   10/16/15 0845  clindamycin  (CLEOCIN) IVPB 600 mg     600 mg 100 mL/hr over 30 Minutes Intravenous  Once 10/16/15 0833 10/16/15 0756   10/15/15 0815  vancomycin (VANCOCIN) IVPB 1000 mg/200 mL premix     1,000 mg 200 mL/hr over 60 Minutes Intravenous To [facility] 10/15/15 0800 10/16/15 0816   10/14/15 1500  vancomycin (VANCOCIN) IVPB 1000 mg/200 mL premix     1,000 mg 200 mL/hr over 60 Minutes Intravenous Every 12 hours 10/14/15 0603 10/14/15 1811   10/14/15 0315  vancomycin (VANCOCIN) IVPB 1000 mg/200 mL premix     1,000 mg 200 mL/hr over 60 Minutes Intravenous  Once 10/14/15 0305 10/14/15 0416     Code Status:     Code Status Orders        Start     Ordered   10/14/15 0524  Do not attempt resuscitation (DNR)   Continuous    Question Answer Comment  In the event of cardiac or respiratory ARREST Do not call a "code blue"   In the event of cardiac or respiratory ARREST Do not perform Intubation, CPR, defibrillation or ACLS   In the event of cardiac or respiratory ARREST Use medication by any route, position, wound care, and other measures to relive pain and suffering. May use oxygen, suction and manual treatment of airway obstruction as needed for comfort.      10/14/15 0523    Code Status History    Date Active Date Inactive Code Status Order ID Comments User Context   10/14/2015  3:41 AM 10/14/2015  5:23 AM DNR [id number]  Lorretta Harp, MD ED    Advance Directive Documentation        Most Recent Value   Type of Advance Directive  Healthcare Power of Attorney, Living will   Pre-existing out of facility DNR order (yellow form or pink MOST form)     "MOST" Form in Place?       Family Communication: daughter at bedside Disposition Plan: will need SNF  DVT prophylaxis: Lovenox Consultants: ortho Procedures:     Objective: Filed Weights   10/14/15 0056 10/17/15 0404 10/18/15 0617  Weight: 67.132 kg (148 lb) 68.5 kg (151 lb 0.2 oz) 77.3 kg (170 lb 6.7 oz)    Intake/Output Summary (Last 24  hours) at 10/20/15 1120 Last data filed at 10/20/15 0741  Gross per 24 hour  Intake 1348.73 ml  Output   3475 ml  Net -2126.27 ml     Vitals Filed Vitals:   10/20/15 0419 10/20/15 0741 10/20/15 0800 10/20/15 0942  BP:  167/93 162/110   Pulse:  90 90   Temp: 97.7 F (36.5 C)  97.7 F (36.5 C)   TempSrc: Axillary  Axillary   Resp:  22 23   Height:      Weight:      SpO2:  96% 94%    Exam:  General:  Pt is sleepy but more communicative than yesterday  HEENT: No icterus, No thrush, oral mucosa moist  Cardiovascular: regular rate and rhythm, S1/S2 No murmur  Respiratory: clear to auscultation bilaterally   Abdomen: Soft, +Bowel sounds, non tender, non distended, no guarding  MSK: No cyanosis or clubbing- no pedal edema- left leg in splint    Data Reviewed: Basic Metabolic Panel:  Recent Labs Lab 10/15/15 0500 10/16/15 0528 10/17/15 0324 10/18/15 0449 10/19/15 1030  NA 135 137 135 137 139  K 3.9 3.7 3.9 3.9 3.5  CL 96* 104 102 103 107  CO2 25 29 26 27 25   GLUCOSE 201* 218* 235* 245* 200*  BUN 6 7 9 9 9   CREATININE 0.71 0.69 0.78 0.77 0.72  CALCIUM 8.0* 8.5* 8.1* 8.2* 7.8*   Liver Function Tests:  Recent Labs Lab 10/14/15 0052 10/14/15 0535  AST 17 18  ALT 11* 12*  ALKPHOS 66 54  BILITOT 0.4 0.7  PROT 6.5 5.7*  ALBUMIN 3.5 3.0*   No results for input(s): LIPASE, AMYLASE in the last 168 hours.  Recent Labs Lab 10/18/15 1738  AMMONIA 25   CBC:  Recent Labs Lab 10/16/15 0528 10/17/15 0324 10/18/15 0449 10/19/15 1030 10/20/15 0315  WBC 8.0 10.2 10.2 9.5 10.2  HGB 9.8* 8.9* 8.6* 8.6* 8.5*  HCT 32.1* 27.6* 27.3* 27.6* 28.3*  MCV 78.3 77.7* 80.1 80.7 80.9  PLT 223 206 210 269 306   Cardiac Enzymes: No results for input(s): CKTOTAL, CKMB, CKMBINDEX, TROPONINI in the last 168 hours. BNP (last 3 results)  Recent Labs  10/14/15 0535  BNP 141.5*    ProBNP (last 3 results) No results for input(s): PROBNP in the last 8760  hours.  CBG:  Recent Labs Lab 10/19/15 0720 10/19/15 1123 10/19/15 1617 10/19/15 2112 10/20/15 0812  GLUCAP 172* 177* 191* 186* 186*    Recent Results (from the past 240 hour(s))  Surgical pcr screen     Status: None   Collection Time: 10/15/15 10:20 PM  Result Value Ref Range Status   MRSA, PCR NEGATIVE NEGATIVE Final   Staphylococcus aureus NEGATIVE NEGATIVE Final    Comment:        The Xpert SA Assay (FDA approved for NASAL specimens in patients over 55 years of age), is one component of a comprehensive surveillance program.  Test performance has been validated by [facility] for patients greater than or equal to 72 year old. It is not intended to diagnose infection nor to guide or monitor treatment.      Studies: Ct Head W Wo Contrast  10/18/2015  CLINICAL DATA:  Recent orthopedic surgery.  Confusion. EXAM: CT HEAD WITHOUT AND WITH CONTRAST TECHNIQUE: Contiguous axial images were obtained from the base of the skull through the vertex without and with intravenous contrast CONTRAST:  50mL OMNIPAQUE IOHEXOL 300 MG/ML  SOLN COMPARISON:  10/14/2015 FINDINGS: There is newly seen low density in the left cerebellum that could be a recent infarction. Cerebral hemispheres show atrophy and chronic small vessel disease but no sign of supratentorial acute infarction. No mass lesion, hemorrhage, hydrocephalus or extra-axial collection. No abnormal contrast enhancement. IMPRESSION: Brain atrophy. Newly seen low density in the left cerebellum that could represent an acute infarction. Electronically Signed   By: Paulina Fusi M.D.   On: 10/18/2015 14:30   Ct Angio Chest Pe W/cm &/or Wo Cm  10/19/2015  CLINICAL DATA:  Shortness of breath. Possible pulmonary embolus. Recent  left femur fracture. EXAM: CT ANGIOGRAPHY CHEST WITH CONTRAST TECHNIQUE: Multidetector CT imaging of the chest was performed using the standard protocol during bolus administration of intravenous contrast. Multiplanar CT  image reconstructions and MIPs were obtained to evaluate the vascular anatomy. CONTRAST:  70mL OMNIPAQUE IOHEXOL 350 MG/ML SOLN COMPARISON:  None. FINDINGS: Technically adequate study with good opacification of the central and segmental pulmonary arteries. Filling defects are demonstrated in both distal main pulmonary arteries, extending into bilateral upper and lower lobe pulmonary arteries. Examination is positive for acute pulmonary embolus. RV to LV ratio measures 2.83, suggesting possible right heart strain. The left atrium and right atrium are also dilated. Coronary artery calcifications. Esophagus is decompressed. No significant lymphadenopathy in the chest. Normal caliber thoracic aorta with scattered calcification. Evaluation of lungs is limited due to respiratory motion artifact. There is mild dependent change in the lung bases with small bilateral pleural effusions. Ingestion of focal ground-glass nodules in the left mid lung, measuring up to 7 mm diameter. These are likely to be inflammatory but consider follow-up in 3 months for further evaluation. Hazy mosaic pattern to the upper lungs may indicate edema. No pneumothorax. Included portions of the upper abdominal organs are grossly unremarkable. Degenerative changes throughout the spine. There is anterior compression of a mid thoracic vertebra. Age is indeterminate. Normal alignment of the thoracic spine with accentuated kyphosis at the level of the compressed vertebra. Review of the MIP images confirms the above findings. IMPRESSION: Examination is positive for bilateral central and segmental pulmonary emboli. Elevated RV to LV ratio suggest evidence of possible right heart strain. Right atrium and left atrial also enlarged. Small bilateral pleural effusions with possible pulmonary edema. Ground-glass nodule in the left mid lung. Consider follow-up in 3 months for further evaluation. These results were called by telephone at the time of interpretation  on 10/19/2015 at 1:30 am to the patient's nurse on [facility], Ulla Potash, who verbally acknowledged these results. Electronically Signed   By: Burman Nieves M.D.   On: 10/19/2015 01:32   Mr Brain Wo Contrast  10/18/2015  CLINICAL DATA:  Acute onset of mental status changes. Abnormal head CT suggesting cerebellar infarction. EXAM: MRI HEAD WITHOUT CONTRAST TECHNIQUE: Multiplanar, multiecho pulse sequences of the brain and surrounding structures were obtained without intravenous contrast. COMPARISON:  Head CT same day FINDINGS: Diffusion imaging shows acute infarction in the left cerebellum at the site of the CT hypodensity. In addition, there is infarction throughout the left posterior cerebral artery territory affecting the posterior medial temporal lobe and occipital lobe as well as focal involvement of lateral thalamus. There is also some acute infarction in the right occipital cortex. Acute infarction is present in the left parietal cortical and subcortical brain and in the posterior left frontal deep white matter. There is no detectable hemorrhage. No mass effect or shift. No hydrocephalus or extra-axial collection. IMPRESSION: Acute infarction in the left cerebellum as suggested by CT. Acute infarction throughout the left posterior cerebral artery territory affecting the posterior medial temporal lobe and occipital lobe with focal involvement of the lateral thalamus. Additional acute infarction in the left parietal cortical and subcortical brain. Additional sub cm infarction in the left posterior frontal deep white matter. Electronically Signed   By: Paulina Fusi M.D.   On: 10/18/2015 19:43   Dg Chest Port 1 View  10/19/2015  CLINICAL DATA:  Hypoxia EXAM: PORTABLE CHEST 1 VIEW COMPARISON:  10/17/2015 FINDINGS: Cardiomegaly. No overt edema. Left basilar airspace opacity could reflect atelectasis or infiltrate,  slightly improved since prior study. No visible effusions. No acute bony abnormality. IMPRESSION:  Cardiomegaly. Left base atelectasis or infiltrate slightly improved. Electronically Signed   By: Charlett Nose M.D.   On: 10/19/2015 08:53    Scheduled Meds:  Scheduled Meds: .  stroke: mapping our early stages of recovery book   Does not apply Once  . atorvastatin  40 mg Oral q1800  . citalopram  10 mg Oral Daily  . dorzolamide  1 drop Both Eyes BID   And  . timolol  1 drop Both Eyes BID  . feeding supplement (ENSURE ENLIVE)  237 mL Oral BID BM  . insulin aspart  0-15 Units Subcutaneous TID WC  . insulin aspart  0-5 Units Subcutaneous QHS  . pantoprazole  40 mg Oral Daily  . senna-docusate  2 tablet Oral QHS  . tiotropium  18 mcg Inhalation Daily  . verapamil  20 mg Oral BID   Continuous Infusions: . sodium chloride    . heparin 1,100 Units/hr (10/20/15 0741)    Time spent on care of this patient: 35 min   Elizandro Laura, MD 10/20/2015, 11:20 AM  LOS: 6 days   [facility] Office  [phone] Pager - Text Page per [email or web address] If 7PM-7AM, please contact night-coverage [email or web address]

## 2015-10-20 NOTE — Procedures (Signed)
[facility]  [address]  [place]. [place] [address].  [phone]   TRANSCRANIAL DOPPLER BUBBLE STUDY  [name] [name]  Date of Birth: 05/10/1922 Medical Record Number: [id number] Indications: stroke with LE DVT Date of Procedure: 10/20/2015 Clinical History: stroke with PE and LE DVT Technical Description: Transcranial Doppler Bubble Study was performed at the bedside after taking written informed consent from the patient and explaining risk/benefits. The right middle cerebral artery was insonated using a hand held probe. And IV line had been previously inserted in the left forearm by the RN using aseptic precautions. Agitated saline injection at rest and after valsalva maneuver did not result in high intensity transient signals (HITS). However, pt maneuver is sub optimal and not effective due to cognitive impairment and lethargy. Impression: negative Transcranial Doppler Bubble Study indicative of no right to left shunt   Results were explained to the patient. Questions were answered.  Marvel Plan, MD PhD Stroke Neurology 10/20/2015 5:43 PM

## 2015-10-20 NOTE — Progress Notes (Signed)
   Subjective:  Patient reports pain as mild to moderate.  Diagnosed with DVT/PE over the weekend - now on heparin gtt.  Objective:   VITALS:   Filed Vitals:   10/20/15 0741 10/20/15 0800 10/20/15 0942 10/20/15 1130  BP: 167/93 162/110  151/80  Pulse: 90 90    Temp:  97.7 F (36.5 C)  98.5 F (36.9 C)  TempSrc:  Axillary  Axillary  Resp: 22 23    Height:      Weight:      SpO2:  96% 94%     ABD soft Sensation intact distally Intact pulses distally Dorsiflexion/Plantar flexion intact Incision: dressing C/D/I Compartment soft Knee immobilizer in place  Lab Results  Component Value Date   WBC 10.2 10/20/2015   HGB 8.5* 10/20/2015   HCT 28.3* 10/20/2015   MCV 80.9 10/20/2015   PLT 306 10/20/2015   BMET    Component Value Date/Time   NA 139 10/19/2015 1030   K 3.5 10/19/2015 1030   CL 107 10/19/2015 1030   CO2 25 10/19/2015 1030   GLUCOSE 200* 10/19/2015 1030   BUN 9 10/19/2015 1030   CREATININE 0.72 10/19/2015 1030   CALCIUM 7.8* 10/19/2015 1030   GFRNONAA >60 10/19/2015 1030   GFRAA >60 10/19/2015 1030     Assessment/Plan: 4 Days Post-Op   Principal Problem:   Open femur fracture, left (HCC) Active Problems:   Diabetes mellitus without complication (HCC)   COPD (chronic obstructive pulmonary disease) (HCC)   Atrial fibrillation (HCC)   CHF (congestive heart failure) (HCC)   HLD (hyperlipidemia)   GERD (gastroesophageal reflux disease)   Femur fracture, left (HCC)   Fall   Chronic obstructive pulmonary disease (HCC)   Open fracture of left femur, type I or II, initial encounter   Cerebral thrombosis with cerebral infarction   CVA (cerebral infarction)   Pulmonary emboli (HCC)   Knee immobilizer at all times NWB LLE DVT/PE: per primary team PT/OT Avoid narcotics Dispo: d/c planning   Williard Keller, [name] 10/20/2015, 2:24 PM   Samson Frederic, MD Cell [phone]

## 2015-10-20 NOTE — Progress Notes (Signed)
Spoke with Dr. Butler Denmark about prn pain med need, patient being too sleepy to swallow safely; and increase in BP this morning.  No new orders received.  Dr. Butler Denmark will be in the unit in approximately 30 minutes and will assess things then.

## 2015-10-21 ENCOUNTER — Inpatient Hospital Stay (HOSPITAL_COMMUNITY): Payer: Medicare Other

## 2015-10-21 DIAGNOSIS — I480 Paroxysmal atrial fibrillation: Secondary | ICD-10-CM | POA: Insufficient documentation

## 2015-10-21 DIAGNOSIS — Z789 Other specified health status: Secondary | ICD-10-CM

## 2015-10-21 DIAGNOSIS — I6789 Other cerebrovascular disease: Secondary | ICD-10-CM

## 2015-10-21 LAB — DRVVT CONFIRM: DRVVT CONFIRM: 1.2 ratio (ref 0.8–1.2)

## 2015-10-21 LAB — BASIC METABOLIC PANEL
ANION GAP: 11 (ref 5–15)
BUN: 8 mg/dL (ref 6–20)
CHLORIDE: 102 mmol/L (ref 101–111)
CO2: 24 mmol/L (ref 22–32)
CREATININE: 0.74 mg/dL (ref 0.44–1.00)
Calcium: 8 mg/dL — ABNORMAL LOW (ref 8.9–10.3)
GFR calc non Af Amer: >60 mL/min (ref >60)
Glucose, Bld: 201 mg/dL — ABNORMAL HIGH (ref 65–99)
Potassium: 2.8 mmol/L — ABNORMAL LOW (ref 3.5–5.1)
SODIUM: 137 mmol/L (ref 135–145)

## 2015-10-21 LAB — CBC
HEMATOCRIT: 29.1 % — AB (ref 36.0–46.0)
HEMOGLOBIN: 8.9 g/dL — AB (ref 12.0–15.0)
MCH: 24.5 pg — ABNORMAL LOW (ref 26.0–34.0)
MCHC: 30.6 g/dL (ref 30.0–36.0)
MCV: 80.2 fL (ref 78.0–100.0)
Platelets: 363 10*3/uL (ref 150–400)
RBC: 3.63 MIL/uL — AB (ref 3.87–5.11)
RDW: 19.8 % — ABNORMAL HIGH (ref 11.5–15.5)
WBC: 9.4 10*3/uL (ref 4.0–10.5)

## 2015-10-21 LAB — CARDIOLIPIN ANTIBODIES, IGG, IGM, IGA
Anticardiolipin IgA: <9 APL U/mL (ref 0–11)
Anticardiolipin IgM: 10 MPL U/mL (ref 0–12)

## 2015-10-21 LAB — LUPUS ANTICOAGULANT PANEL
DRVVT: 72.8 s — AB (ref 0.0–44.0)
PTT LA: 66 s — AB (ref 0.0–40.6)

## 2015-10-21 LAB — GLUCOSE, CAPILLARY
GLUCOSE-CAPILLARY: 219 mg/dL — AB (ref 65–99)
GLUCOSE-CAPILLARY: 218 mg/dL — AB (ref 65–99)
GLUCOSE-CAPILLARY: 192 mg/dL — AB (ref 65–99)
Glucose-Capillary: 221 mg/dL — ABNORMAL HIGH (ref 65–99)

## 2015-10-21 LAB — BETA-2-GLYCOPROTEIN I ABS, IGG/M/A
Beta-2 Glyco I IgG: <9 GPI IgG units (ref 0–20)
Beta-2-Glycoprotein I IgA: <9 GPI IgA units (ref 0–25)

## 2015-10-21 LAB — HEPARIN LEVEL (UNFRACTIONATED): HEPARIN UNFRACTIONATED: 0.44 [IU]/mL (ref 0.30–0.70)

## 2015-10-21 LAB — PTT-LA MIX: PTT-LA Mix: 65.5 s — ABNORMAL HIGH (ref 0.0–40.6)

## 2015-10-21 LAB — HEXAGONAL PHASE PHOSPHOLIPID: Hexagonal Phase Phospholipid: 16 s — ABNORMAL HIGH (ref 0–11)

## 2015-10-21 LAB — APTT: APTT: 80 s — AB (ref 24–37)

## 2015-10-21 LAB — DRVVT MIX: dRVVT Mix: 53.3 s — ABNORMAL HIGH (ref 0.0–44.0)

## 2015-10-21 MED ORDER — VERAPAMIL HCL 40 MG PO TABS
40.0000 mg | ORAL_TABLET | Freq: Two times a day (BID) | ORAL | Status: DC
  Administered 2015-10-21 – 2015-10-23 (×4): 40 mg via ORAL
  Filled 2015-10-21 (×5): qty 1

## 2015-10-21 MED ORDER — POTASSIUM CHLORIDE CRYS ER 20 MEQ PO TBCR
40.0000 meq | EXTENDED_RELEASE_TABLET | ORAL | Status: AC
  Administered 2015-10-21 (×2): 40 meq via ORAL
  Filled 2015-10-21 (×2): qty 2

## 2015-10-21 MED ORDER — LINAGLIPTIN 5 MG PO TABS
5.0000 mg | ORAL_TABLET | Freq: Every day | ORAL | Status: DC
Start: 2015-10-21 — End: 2015-10-23
  Administered 2015-10-21 – 2015-10-23 (×3): 5 mg via ORAL
  Filled 2015-10-21 (×3): qty 1

## 2015-10-21 MED ORDER — SODIUM CHLORIDE 0.9 % IV SOLN
INTRAVENOUS | Status: DC
  Administered 2015-10-21 – 2015-10-23 (×3): via INTRAVENOUS

## 2015-10-21 MED ORDER — POTASSIUM CHLORIDE 10 MEQ/100ML IV SOLN
10.0000 meq | INTRAVENOUS | Status: DC
  Administered 2015-10-21: 10 meq via INTRAVENOUS
  Filled 2015-10-21 (×4): qty 100

## 2015-10-21 MED ORDER — OXYCODONE HCL 5 MG/5ML PO SOLN
2.5000 mg | ORAL | Status: DC | PRN
Start: 2015-10-21 — End: 2015-10-23

## 2015-10-21 MED ORDER — LORAZEPAM 2 MG/ML IJ SOLN: 0.5000 mg | Freq: Once | INTRAMUSCULAR | Status: DC

## 2015-10-21 MED ORDER — POTASSIUM CHLORIDE 20 MEQ/15ML (10%) PO SOLN: 40.0000 meq | Freq: Once | ORAL | Status: DC

## 2015-10-21 MED ORDER — ACETAMINOPHEN 325 MG PO TABS: 650.0000 mg | ORAL_TABLET | Freq: Four times a day (QID) | ORAL | Status: DC

## 2015-10-21 MED ORDER — INSULIN ASPART 100 UNIT/ML ~~LOC~~ SOLN
0.0000 [IU] | Freq: Three times a day (TID) | SUBCUTANEOUS | Status: DC
  Administered 2015-10-22: 8 [IU] via SUBCUTANEOUS
  Administered 2015-10-22: 3 [IU] via SUBCUTANEOUS

## 2015-10-21 MED ORDER — MIRTAZAPINE 15 MG PO TBDP
15.0000 mg | ORAL_TABLET | Freq: Every day | ORAL | Status: DC
  Administered 2015-10-21 – 2015-10-22 (×2): 15 mg via ORAL
  Filled 2015-10-21 (×3): qty 1

## 2015-10-21 MED ORDER — IPRATROPIUM-ALBUTEROL 0.5-2.5 (3) MG/3ML IN SOLN: 3.0000 mL | Freq: Four times a day (QID) | RESPIRATORY_TRACT | Status: DC | PRN

## 2015-10-21 NOTE — Care Management Important Message (Signed)
Important Message  Patient Details  Name: [name] [name] MRN: [id number] Date of Birth: 09-08-1922   Medicare Important Message Given:  Yes    Jaelee Laughter P Mailee Klaas 10/21/2015, 3:22 PM

## 2015-10-21 NOTE — Consult Note (Addendum)
Consultation Note Date: 10/21/2015   Patient Name: [name] [name]  DOB: Aug 09, 1922  MRN: [id number]  Age / Sex: 82 y.o., female  PCP: Gordan Payment, MD Referring Physician: Calvert Cantor, MD  Reason for Consultation: Disposition, Establishing goals of care and Psychosocial/spiritual support  Clinical Assessment/Narrative: 82 yo woman from [place] with CHF, DM well controlled, COPD, Chronic Afib, CVA and recent fall with femur fracture-highly functional prior to fall. PMT consulted for support and goals of care.  Mrs. Kandel has 3 daughters, [name] is the [place] and daughter that lives with her on their farm in [place]. Daughter is very involved in her mothers care and and also wioth teh care of her sister who is a heart transplant reipient now hospitalized at [facility]. Marcele has always committed to caring for her mother at home- and also committed to preserving her dignity, preventing suffering and making sure that her mothers care is the best that it can be. Theda Sers is very realistic about her mothers age-the complications of this hospitalization and about possible trajectories of her mothers illness.   Summary of Goals:  To provide reasonable and appropriate medical interventions for reversible disease, to minimize her medications and side effects, to avoid diagnostics unless requires, to avoid any painful interventions or further debility with the goal of being able to give short term rehab a trial at [facility] ([facility] where her sister will be going also. She wants to give her mother every best chance to improve and if she does not improve Marcele will make a decision to take her home with hospice care for her remaining time.  SUMMARY OF RECOMMENDATIONS  Code Status/Advance Care Planning: DNR    Code Status Orders        Start     Ordered   10/19/15 0328  Limited resuscitation (code)   Continuous     Question Answer Comment  In the event of cardiac or respiratory ARREST: Initiate Code Blue, Call Rapid Response Yes   In the event of cardiac or respiratory ARREST: Perform CPR No   In the event of cardiac or respiratory ARREST: Perform Intubation/Mechanical Ventilation Yes   In the event of cardiac or respiratory ARREST: Use NIPPV/BiPAp only if indicated Yes   In the event of cardiac or respiratory ARREST: Administer ACLS medications if indicated Yes   In the event of cardiac or respiratory ARREST: Perform Defibrillation or Cardioversion if indicated No      10/19/15 0328    Code Status History    Date Active Date Inactive Code Status Order ID Comments User Context   10/16/2015  1:24 PM 10/19/2015  3:28 AM DNR [id number]  Samson Frederic, MD Inpatient   10/14/2015  5:23 AM 10/16/2015  1:24 PM DNR [id number]  Venita Lick, MD Inpatient   10/14/2015  3:41 AM 10/14/2015  5:23 AM DNR [id number]  Lorretta Harp, MD ED    Advance Directive Documentation        Most Recent Value   Type of Advance Directive  Healthcare Power of Attorney, Living will   Pre-existing out of facility DNR order (yellow form or pink MOST form)     "MOST" Form in Place?        Other Directives:Advanced Directive  Symptom Management:   Pain: Scheduled Tylenol, low dose PRN opiates  Dysphagia: no modified diets, no thick liquids, OOB for meals, careful hand feeding and reasonable aspiration prevention techniques.  Delirium prevention will be critical  Palliative Prophylaxis:  Aspiration, Delirium Protocol, Frequent Pain Assessment, Oral Care and Turn Reposition  Additional Recommendations (Limitations, Scope, Preferences):  Avoid Hospitalization and Minimize Medications  Minimize medications- reduce medication list where possible  Discontinued Megace-prothrombotic, risk not >benefits-her anorexia and poor appetite are related to her medical disease and advanced age  Continue Mirtazapine  Discontinued  Celexa  Stopped Atorvastatin- Cardiac cachexia and sarcopenia is also associated statins and increased sided effects  Stopped Spiriva-will use Duonebs PRN, reviewed CT chest- no serious COPD findings- possibly some restrictive dyspnea from her kyphosis  Consider transitioning to ATI off heparin gtt.  Hypokalemia-check magnesium in setting of malnutrition and weight loss/FTT  Avoid frequent CBG sticks with SSI insulin alone- she was not on basal insulin prior to admission- will restart her Januvia since she has resumed PO intake even though small amounts- will check qhs CBG and if greater than 250 or symptomatic will add on a basal- trying to get her on a less invasive regimen.  OOB for all meals with precautions-no modified diets- and they understand aspiration risks-this has been going on for months if not years.  Psycho-social/Spiritual:  Support System: Strong Desire for further Chaplaincy support:yes Additional Recommendations: Caregiving  Support/Resources, Education on Hospice and Referral to [facility]   Prognosis: < 12 months, possibly much less if she declines in post-acute care  Discharge Planning: Skilled Nursing Facility for rehab with Palliative care service follow-up- daughter wants [facility] in [place], second choice would be [facility] [place]. Low threshold for taking her home with hospice if she declines.  Chief Complaint/ Primary Diagnoses: Present on Admission:  . Atrial fibrillation (HCC) . COPD (chronic obstructive pulmonary disease) (HCC) . HLD (hyperlipidemia) . GERD (gastroesophageal reflux disease) . Open femur fracture, left (HCC) . Femur fracture, left (HCC) . Fall . Open fracture of left femur, type I or II, initial encounter  I have reviewed the medical record, interviewed the patient and family, and examined the patient. The following aspects are pertinent.  Past Medical History  Diagnosis Date  . Hypertension   . Diabetes mellitus  without complication (HCC)   . COPD (chronic obstructive pulmonary disease) (HCC)   . Atrial fibrillation (HCC)   . Glaucoma     Bilaterally  . CHF (congestive heart failure) (HCC)   . HLD (hyperlipidemia)   . GERD (gastroesophageal reflux disease)   . Depression    Social History   Social History  . Marital Status: Widowed    Spouse Name: N/A  . Number of Children: N/A  . Years of Education: N/A   Social History Main Topics  . Smoking status: Never Smoker   . Smokeless tobacco: None  . Alcohol Use: No  . Drug Use: None  . Sexual Activity: Not Asked   Other Topics Concern  . None   Social History Narrative   History reviewed. No pertinent family history. Scheduled Meds: . antiseptic oral rinse  7 mL Mouth Rinse BID  . atorvastatin  40 mg Oral q1800  . citalopram  10 mg Oral Daily  . dorzolamide  1 drop Both Eyes BID   And  . timolol  1 drop Both Eyes BID  . feeding supplement (ENSURE ENLIVE)  237 mL Oral BID BM  . insulin aspart  0-15 Units Subcutaneous TID WC  . insulin aspart  0-5 Units Subcutaneous QHS  . megestrol  400 mg Oral BID  . mirtazapine  15 mg Oral QHS  . pantoprazole  40 mg Oral Daily  . senna-docusate  2 tablet Oral QHS  . tiotropium  18 mcg Inhalation Daily  . verapamil  40 mg Oral BID   Continuous Infusions: . sodium chloride 50 mL/hr at 10/20/15 2156  . heparin 1,100 Units/hr (10/20/15 2300)   PRN Meds:.acetaminophen **OR** acetaminophen, levalbuterol, menthol-cetylpyridinium **OR** phenol, methocarbamol (ROBAXIN)  IV, metoCLOPramide **OR** metoCLOPramide (REGLAN) injection, ondansetron **OR** ondansetron (ZOFRAN) IV, polyvinyl alcohol Medications Prior to Admission:  Prior to Admission medications   Medication Sig Start Date End Date Taking? Authorizing Provider  acetaminophen (TYLENOL) 500 MG tablet Take 1,000 mg by mouth every 6 (six) hours as needed for mild pain.   Yes Historical Provider, MD  bacitracin ophthalmic ointment Place 1  application into both eyes 4 (four) times daily as needed for wound care. apply to eye   Yes Historical Provider, MD  citalopram (CELEXA) 10 MG tablet Take 10 mg by mouth daily.   Yes Historical Provider, MD  cyanocobalamin (,VITAMIN B-12,) 1000 MCG/ML injection Inject 1,000 mcg into the muscle every 30 (thirty) days.   Yes Historical Provider, MD  dorzolamide-timolol (COSOPT) 22.3-6.8 MG/ML ophthalmic solution Place 1 drop into both eyes 2 (two) times daily.   Yes Historical Provider, MD  esomeprazole (NEXIUM) 40 MG capsule Take 40 mg by mouth daily before breakfast.    Yes Historical Provider, MD  isosorbide mononitrate (IMDUR) 60 MG 24 hr tablet Take 60 mg by mouth daily.   Yes Historical Provider, MD  levalbuterol [facility] HFA) 45 MCG/ACT inhaler Inhale 1-2 puffs into the lungs every 4 (four) hours as needed for wheezing.   Yes Historical Provider, MD  metFORMIN (GLUCOPHAGE-XR) 500 MG 24 hr tablet Take 500 mg by mouth every evening.   Yes Historical Provider, MD  nitroGLYCERIN (NITROLINGUAL) 0.4 MG/SPRAY spray Place 1 spray under the tongue every 5 (five) minutes x 3 doses as needed for chest pain.   Yes Historical Provider, MD  Polyethyl Glycol-Propyl Glycol (SYSTANE) 0.4-0.3 % SOLN Apply 1-2 drops to eye 4 (four) times daily as needed (dry eyes).    Yes Historical Provider, MD  pravastatin (PRAVACHOL) 40 MG tablet Take 40 mg by mouth daily.   Yes Historical Provider, MD  rivaroxaban (XARELTO) 20 MG TABS tablet Take 20 mg by mouth every morning.    Yes Historical Provider, MD  sitaGLIPtin (JANUVIA) 25 MG tablet Take 25 mg by mouth daily.   Yes Historical Provider, MD  tiotropium (SPIRIVA) 18 MCG inhalation capsule Place 18 mcg into inhaler and inhale daily.   Yes Historical Provider, MD  verapamil (CALAN) 40 MG tablet Take 40 mg by mouth 2 (two) times daily.   Yes Historical Provider, MD   Allergies  Allergen Reactions  . Penicillins Other (See Comments)    Review of Systems  Physical  Exam  Vital Signs: BP 155/88 mmHg  Pulse 79  Temp(Src) 97.5 F (36.4 C) (Oral)  Resp 24  Ht  (1.6 m)  Wt 70 kg (154 lb 5.2 oz)  BMI 27.34 kg/m2  SpO2 98%  SpO2: SpO2: 98 % O2 Device:SpO2: 98 % O2 Flow Rate: .O2 Flow Rate (L/min): 47 L/min  IO: Intake/output summary:  Intake/Output Summary (Last 24 hours) at 10/21/15 1636 Last data filed at 10/21/15 1101  Gross per 24 hour  Intake 1396.67 ml  Output   2200 ml  Net -803.33 ml    LBM: Last BM Date: 10/20/15 Baseline Weight: Weight: 67.132 kg (148 lb) Most recent weight: Weight: 70 kg (154 lb 5.2 oz)      Palliative  Assessment/Data:  Flowsheet Rows        Most Recent Value   Intake Tab    Referral Department  Hospitalist   Unit at Time of Referral  Intermediate Care Unit   Palliative Care Primary Diagnosis  Cardiac   Date Notified  10/21/15   Palliative Care Type  New Palliative care   Reason for referral  Clarify Goals of Care   Date of Admission  10/14/15   # of days IP prior to Palliative referral  7   Clinical Assessment    Palliative Performance Scale Score  40%   Pain Max last 24 hours  10   Pain Min Last 24 hours  0   Dyspnea Max Last 24 Hours  Not able to report   Dyspnea Min Last 24 hours  Not able to report   Nausea Max Last 24 Hours  Not able to report   Nausea Min Last 24 Hours  Not able to report   Anxiety Max Last 24 Hours  Not able to report   Anxiety Min Last 24 Hours  Not able to report   Other Max Last 24 Hours  Not able to report   Psychosocial & Spiritual Assessment    Palliative Care Outcomes    Patient/Family meeting held?  Yes   Who was at the meeting?  Primitivo Gauze   Palliative Care Outcomes  Improved pain interventions, Clarified goals of care, Provided psychosocial or spiritual support, Counseled regarding hospice, Provided advance care planning   Palliative Care follow-up planned  Yes, Facility   Other Treatment Preference Instructions  Follow-up at [facility]      Additional  Data Reviewed:  CBC:    Component Value Date/Time   WBC 9.4 10/21/2015 0415   HGB 8.9* 10/21/2015 0415   HCT 29.1* 10/21/2015 0415   PLT 363 10/21/2015 0415   MCV 80.2 10/21/2015 0415   Comprehensive Metabolic Panel:    Component Value Date/Time   NA 137 10/21/2015 0415   K 2.8* 10/21/2015 0415   CL 102 10/21/2015 0415   CO2 24 10/21/2015 0415   BUN 8 10/21/2015 0415   CREATININE 0.74 10/21/2015 0415   GLUCOSE 201* 10/21/2015 0415   CALCIUM 8.0* 10/21/2015 0415   AST 18 10/14/2015 0535   ALT 12* 10/14/2015 0535   ALKPHOS 54 10/14/2015 0535   BILITOT 0.7 10/14/2015 0535   PROT 5.7* 10/14/2015 0535   ALBUMIN 3.0* 10/14/2015 0535     Time In: 3:45 Time Out: 5PM Time Total: 75 min Greater than 50%  of this time was spent counseling and coordinating care related to the above assessment and plan.  Signed by: Hilbert Odor, DO  10/21/2015, 4:36 PM  Please contact Palliative Medicine Team phone at [phone] for questions and concerns.

## 2015-10-21 NOTE — Progress Notes (Signed)
ANTICOAGULATION CONSULT NOTE - Follow up Consult  Pharmacy Consult for heparin Indication: pulmonary embolus and stroke    Patient Measurements: Height:  (160 cm) Weight: 154 lb 5.2 oz (70 kg) IBW/kg (Calculated) : 52.4 Heparin Dosing Weight: 70kg  Vital Signs: Temp: 98.2 F (36.8 C) (01/31 0440) Temp Source: Axillary (01/31 0440) BP: 149/74 mmHg (01/31 0753) Pulse Rate: 74 (01/31 0440)  Labs:  Recent Labs  10/19/15 1030  10/19/15 1200 10/19/15 1930 10/20/15 0315 10/21/15 0415  HGB 8.6*  --   --   --  8.5* 8.9*  HCT 27.6*  --   --   --  28.3* 29.1*  PLT 269  --   --   --  306 363  APTT  --   < >  --  70* 69* 80*  HEPARINUNFRC  --   --  0.98*  --  0.63 0.44  CREATININE 0.72  --   --   --   --  0.74  < > = values in this interval not displayed.  Estimated Creatinine Clearance: 43 mL/min (by C-G formula based on Cr of 0.74).  Assessment: 82yo female in unfortunate situation s/p stroke after surgery, now w/ PE confirmed on CT w/ signs of RHS, has been on Xarelto for secondary stroke prevention but probably not a good candidate for full-dose Xarelto for PE, to transition to heparin for now. Last xarelto dose 1/28 @ 1054.  aPTT and heparin level are correlating. APTT 80, Heparin level 0.44 today  Goal of Therapy:  Heparin level 0.3-0.5 units/ml aPTT 66-84 seconds Monitor platelets by anticoagulation protocol: Yes   Plan:  Will continue heparin at 1100 units/hr  Daily heparin level, CBC   Thank you for allowing [place] to participate in this patients care. Signe Colt, PharmD 10/21/2015,11:06 AM

## 2015-10-21 NOTE — Progress Notes (Addendum)
Speech Language Pathology Treatment: Dysphagia  Patient Details Name: [name] [name] MRN: [id number] DOB: 1922/08/17 Today's Date: 10/21/2015 Time: 4098-1191 SLP Time Calculation (min) (ACUTE ONLY): 15 min  Assessment / Plan / Recommendation Clinical Impression  Pt continues to demonstrate immediate and delayed coughing with thin and solids textures. Intake has been very poor. Her daughter confirms description of a chronic dysphagia. Pt is at increased risk of pneumonia given new femur fx, CVA, and general deconditioning. Her daughter has noticed pt is typically able to expectorate when coughing, but now is weaker. Daughter verbalizes she wants to prevents aspiration pna if possible, and so SLP recommend objective testing to determine best diet and methods to reduce risk. Daughter in agreement. Continue current diet and precautions for now. Discussed with RN.    HPI HPI: 82 y.o. female hx of A fib, DM, HTN admitted 1/24 after a fall in which she suffered a left leg distal femur fracture, s/p ORIF left femur on 1/26. AMS noted after procedure, prompting an MRI that showed acute infarct in the left cerebellum, left PCA territory involving posterior medial temporal lobe and occipital lobe and multiple infarcts in left parietal lobe. Daughter says that pt coughs frequently on food and liquids PTA, and that this has been a more chronic problem. She also says pt has a h/o reflux.      SLP Plan  MBS     Recommendations  Diet recommendations: Thin liquid;Dysphagia 1 (puree) Liquids provided via: Cup Medication Administration: Crushed with puree Supervision: Full supervision/cueing for compensatory strategies Compensations: Minimize environmental distractions;Slow rate;Small sips/bites Postural Changes and/or Swallow Maneuvers: Seated upright 90 degrees;Out of bed for meals             Plan: MBS     GO               Harlon Ditty, MA CCC-SLP [phone]  Claudine Mouton 10/21/2015,  2:22 PM

## 2015-10-21 NOTE — Progress Notes (Signed)
OT Cancellation Note  Patient Details Name: EMMI WERTHEIM MRN: [id number] DOB: [birth year]   Cancelled Treatment:    Reason Eval/Treat Not Completed: Patient at procedure or test/ unavailable ECHO in room at this time  Felecia Shelling   OTR/L Pager: [phone] Office: [phone] .  10/21/2015, 11:21 AM

## 2015-10-21 NOTE — Progress Notes (Signed)
Inpatient Diabetes Program Recommendations  AACE/ADA: New Consensus Statement on Inpatient Glycemic Control (2015)  Target Ranges:  Prepandial:   less than 140 mg/dL      Peak postprandial:   less than 180 mg/dL (1-2 hours)      Critically ill patients:  140 - 180 mg/dL   Results for SHACOYA, BURKHAMMER (MRN [id number]) as of 10/21/2015 14:19  Ref. Range 10/21/2015 07:06 10/21/2015 11:28  Glucose-Capillary Latest Ref Range: 65-99 mg/dL 562 (H) 130 (H)    Home DM Meds: Metformin 500 mg QPM       Januvia 25 mg daily  Current Insulin Orders: Novolog Moderate SSI (0-15 units) TID AC + HS     MD- if within goals of care, please consider starting low dose basal insulin for this patient while her home oral DM medications are on hold-  Levemir 10 units daily (0.15 units/kg dosing)      --Will follow patient during hospitalization--  Ambrose Finland RN, MSN, CDE Diabetes Coordinator Inpatient Glycemic Control Team Team Pager: [phone] (8a-5p)

## 2015-10-21 NOTE — Progress Notes (Signed)
  Echocardiogram 2D Echocardiogram has been performed.  Arvil Chaco 10/21/2015, 10:02 AM

## 2015-10-21 NOTE — Consult Note (Signed)
CH introduced to pt and family; CH explained in detail role of Palliative consult and their concern about aa questions regarding care; CH noted that BG from PCT will be forthright and address her concerns in GOC regarding pt. [name] [name] 1:19 PM

## 2015-10-21 NOTE — Care Management Note (Signed)
Case Management Note  Patient Details  Name: ALLYNA PITTSLEY MRN: [id number] Date of Birth: [birth year]  Subjective/Objective:   Per pt eval rec SNF,  Per RN informed NCM that daughter's first choice is [facility] in [facility] and her second choice is [facility] in [place].  CSW informed of this infromation and that daughter would like to speak with CSW.                 Action/Plan:   Expected Discharge Date:                  Expected Discharge Plan:  Skilled Nursing Facility  In-House Referral:  Clinical Social Work  Discharge planning Services  CM Consult  Post Acute Care Choice:    Choice offered to:     DME Arranged:    DME Agency:     HH Arranged:    HH Agency:     Status of Service:  In process, will continue to follow  Medicare Important Message Given:  Yes Date Medicare IM Given:    Medicare IM give by:    Date Additional Medicare IM Given:    Additional Medicare Important Message give by:     If discussed at Long Length of Stay Meetings, dates discussed:    Additional Comments:  Leone Haven, RN 10/21/2015, 2:49 PM

## 2015-10-21 NOTE — Evaluation (Signed)
Speech Language Pathology Evaluation Patient Details Name: [name] [name] MRN: [id number] DOB: 05/20/1922 Today's Date: 10/21/2015 Time: 4098-1191 SLP Time Calculation (min) (ACUTE ONLY): 21 min  Problem List:  Patient Active Problem List   Diagnosis Date Noted  . Cerebral thrombosis with cerebral infarction 10/19/2015  . CVA (cerebral infarction)   . Pulmonary emboli (HCC)   . Open fracture of left femur, type I or II, initial encounter 10/16/2015  . Open femur fracture, left (HCC) 10/14/2015  . Femur fracture, left (HCC) 10/14/2015  . Diabetes mellitus without complication (HCC)   . COPD (chronic obstructive pulmonary disease) (HCC)   . Atrial fibrillation (HCC)   . CHF (congestive heart failure) (HCC)   . HLD (hyperlipidemia)   . GERD (gastroesophageal reflux disease)   . Fall   . Chronic obstructive pulmonary disease (HCC)    Past Medical History:  Past Medical History  Diagnosis Date  . Hypertension   . Diabetes mellitus without complication (HCC)   . COPD (chronic obstructive pulmonary disease) (HCC)   . Atrial fibrillation (HCC)   . Glaucoma     Bilaterally  . CHF (congestive heart failure) (HCC)   . HLD (hyperlipidemia)   . GERD (gastroesophageal reflux disease)   . Depression    Past Surgical History:  Past Surgical History  Procedure Laterality Date  . Cardiac catheterization  2003  . Replacement total knee bilateral Bilateral   . I&d extremity Left 10/14/2015    Procedure: IRRIGATION AND DEBRIDEMENT LEFT LEG;  Surgeon: Venita Lick, MD;  Location: [facility] OR;  Service: Orthopedics;  Laterality: Left;  . Orif femur fracture Left 10/16/2015    Procedure: OPEN REDUCTION INTERNAL FIXATION (ORIF) DISTAL FEMUR FRACTURE;  Surgeon: Samson Frederic, MD;  Location: [facility] OR;  Service: Orthopedics;  Laterality: Left;   HPI:  82 y.o. female hx of A fib, DM, HTN admitted 1/24 after a fall in which she suffered a left leg distal femur fracture, s/p ORIF left femur on 1/26. AMS noted  after procedure, prompting an MRI that showed acute infarct in the left cerebellum, left PCA territory involving posterior medial temporal lobe and occipital lobe and multiple infarcts in left parietal lobe. Daughter says that pt coughs frequently on food and liquids PTA, and that this has been a more chronic problem. She also says pt has a h/o reflux.   Assessment / Plan / Recommendation Clinical Impression  Pt demonstrates an exacerbation of baseline cognitive impairment, particularly in the areas of memory. Pt is hard of hearing, but attempts to attend to the conversation around her. She asks questions repeatedly and despite frequent verbal cues regarding orientation, she cannot recall place or situation. She is attending to right visual field well today. Pt is recommended to f/u with SLP to address attention and memory impairment for improved quality of life following d/c.     SLP Assessment  Patient needs continued Speech Lanaguage Pathology Services    Follow Up Recommendations  Skilled Nursing facility    Frequency and Duration min 2x/week  2 weeks      SLP Evaluation Prior Functioning  Cognitive/Linguistic Baseline: Baseline deficits Baseline deficit details: memory impairment Type of Home: House Available Help at Discharge: Family;Available PRN/intermittently   Cognition  Overall Cognitive Status: Impaired/Different from baseline Arousal/Alertness: Awake/alert Orientation Level: Oriented to person;Disoriented to place;Disoriented to situation;Disoriented to time Attention: Focused;Sustained Focused Attention: Appears intact Sustained Attention: Impaired Sustained Attention Impairment: Verbal complex;Functional complex Memory: Impaired Memory Impairment: Retrieval deficit;Storage deficit;Decreased short term memory;Decreased long term  memory Decreased Long Term Memory: Verbal basic Decreased Short Term Memory: Verbal basic    Comprehension  Auditory Comprehension Overall  Auditory Comprehension: Appears within functional limits for tasks assessed    Expression Verbal Expression Overall Verbal Expression: Appears within functional limits for tasks assessed   Oral / Motor  Oral Motor/Sensory Function Overall Oral Motor/Sensory Function: Within functional limits Motor Speech Overall Motor Speech: Appears within functional limits for tasks assessed   GO                   Harlon Ditty, MA CCC-SLP [phone]  Claudine Mouton 10/21/2015, 2:32 PM

## 2015-10-21 NOTE — Progress Notes (Signed)
[facility] education booklet provided to patient's daughter, [name] on 10/20/15, continues to be in the room for her review.

## 2015-10-21 NOTE — Clinical Social Work Note (Signed)
CSW talked with patient's daughter, Jillene Wehrenberg regarding discharge plans. She confirmed that [facility] is still her choice as she knows the owners and staff and is willing to pay to hold a bed if needed. Contact made with Steward Drone, admissions director at [facility] regarding patient. When asked about daughter paying to hold a bed, Steward Drone responded that this is not needed as she feels a bed will be available. Steward Drone asked to be contacted as soon as we know patient is ready for d/c as she has to get pre-authorization from [facility].  CSW will continue to follow and inform facility as soon as patient is medically stable for discharge.  Genelle Bal, MSW, LCSW Licensed Clinical Social Worker Clinical Social Work Department [employer] [phone]

## 2015-10-21 NOTE — Progress Notes (Addendum)
[facility] Progress Note   [name] [name]  HQI:[id number]  DOB: [birth year]  DOA: 10/14/2015 PCP: [name], MD  Brief narrative: [name] [name] is a 82 y.o. female PMH of af-fib on Xarelto, HTN, COPD, DM, s/p bilateral knee replacement by [name] 5-7 years ago, GERD, depression, congestive heart failure, who presents with left knee pain after fall. She is found to have a periprothetic knee fracture. She was doing well up until after ORIF on 1/26 when she failed to awaken- further work up was done and this unfortunately revealed that she had developed multifocal infarcts and Pulmonary emboli in the short duration that she was off of Xarelto. She has since been slow to recover mentally and has had very poor PO intake which is now the limiting factor in her being discharged.    Subjective: More communicative today - PO intake still quite poor but slowly improving  Assessment/Plan: Open periprosthetic femur fracture, left - went to OR for washing on 1/24 and then underwent ORIF on 1/26 - management per ortho- will need to be non-weight bearing for a number of weeks to allow the knee to heal -It was recommended that the patient go to SNF however, the daughter would like to take care of her at home-She understands that this will be very difficult as she is in an immobilizer and is not able to bear any weight on her leg- she understands this and is willing to learn from nurses how to assist her onto a bedpan etc. If she is not able to care for her appropriately, she will allow her to go to SNF from home (rather than be readmitted to the hospital)- will need to add home health social worker when ordering HHPT/ OT   Lethargy- CVAs -  Awakening very little after OR on 1/26 - acute CVA noted -doubtful this was causing her lethargy but may have led to her initial fall which resulted in the fracture -- no infection noted on UA - obtained ammonia level which was normal  - was quite dehydrated and  appeared to awaken after fluid bolus given and therefore, lethargy may all be due to dehydration  - now much more alert but PO intake is still quite poor and I am continuing IV hydration to prevent dehydration  Left post Cerebral, temporal and occipital lobe infarcts - obtained MRI which confirmed multifocal infarcts-  - infarcts likely related to A-fib and occurred in the short duration she was off of Xarelto awaiting surgery - on Heparin now -- does not appear to have focal deficits in arms or right leg- has right hemianopia  - as these infarcts occurred in concurrence with bilateral PEs, ECHO was performed to look for PFO- no PFO found  Acute hypoxic resp failure due to  b/l PE --noted to be hypoxic after surgery - CXR did not reveal an etiology-  Subacute b/l PEs noted on CT - despite being on DVT prophylaxis, PE's occurred in the short period she was off of anticoagulation  -has a history of b/l PE in the past as well even while on Pradaxa - have ordred hypercoag panel (mainly to see if she has a genetic predisposition that could be inherited by her family) - will not order Protein C, S, Anti-thrombin as these will be inaccurate in setting of acute PE and heparin use - Lupus anticoagulation slightly elevated-  asked hematology to review these studies to assist in interpretation - may have antiphospholipid Ab syndrome but difficult to  determine in this setting - would need to repeat in 4 months  --  venous duplex reveals left leg DVT - hemodynamically stable- no dyspnea or chest pain but requiring 4-6 L of O2 - on Heparin - can transition to Xarelto tomorrow   Dehydration - based upon elevated specific gravity on UA, she is dehydrated- BUN Cr ratio not elevated - with alkalosis noted on ABG which may have been contraction alkalosis - bolused with 1 L NS and increased continuous fluids -  - continuing slow IVF and watching I and O very closely as PO intake is still poor  Anorexia -  barely eating after CVA- started Megace and now starting Remeron (family request) to increase appetite - family understands that if oral intake does not increase, her recovery will be very difficult- palliative care consult has been placed based upon family request -family requesting for her to be kept in the hospital for a couple more days to see if PO intake improves prior to taking her home  CHF (congestive heart failure) (HCC):  - No 2-D echo on record, not sure which type of CHF.  - CHF is compensated.  -Hold her Lasix for now as she is compensated and she has minimal PO intake of fluid  Anemia due to acute blood loss - Hb dropped from 12 to 8.9 (due to acute blood loss from knee fracture?) given 1 U PRBC by ortho prior to surgery - Hb improved to about 9.8 and now in 8-9 range  HTN -  Increase Verapamil to home dose today  Leukocytosis:  Likely due to stress-induced demargination. Patient does not have signs of infection and WBC count has normalized  Atrial Fibrillation:  CHA2DS2-VASc Score is 6- Patient is on Xarelto at home.  - resumed Xarelto on the day after surgery as recommended by Ortho but due to acute PE's heparin has been started    DM-II:  - Last A1c not on record. Patient is taking metformin and Januvia at home - SSI while here - A1c is 8.3  COPD:  - stable. -Continue Xopenex inhaler, Spiriva inhaler  HLD: - Last LDL was not on record- current LDL is 121 -Continue home medications: Pravastatin  GERD: -Protonix  Depression:  -Stable, no suicidal or homicidal ideations. -Continue home medications: Celexa    Antibiotics: Anti-infectives    Start     Dose/Rate Route Frequency Ordered Stop   10/17/15 0600  vancomycin (VANCOCIN) IVPB 750 mg/150 ml premix     750 mg 150 mL/hr over 60 Minutes Intravenous Every 24 hours 10/16/15 1411 10/17/15 0753   10/16/15 2000  vancomycin (VANCOCIN) IVPB 1000 mg/200 mL premix  Status:  Discontinued     1,000 mg 200  mL/hr over 60 Minutes Intravenous Every 12 hours 10/16/15 1324 10/16/15 1411   10/16/15 0845  clindamycin (CLEOCIN) IVPB 600 mg     600 mg 100 mL/hr over 30 Minutes Intravenous  Once 10/16/15 0833 10/16/15 0756   10/15/15 0815  vancomycin (VANCOCIN) IVPB 1000 mg/200 mL premix     1,000 mg 200 mL/hr over 60 Minutes Intravenous To ShortStay [facility] 10/15/15 0800 10/16/15 0816   10/14/15 1500  vancomycin (VANCOCIN) IVPB 1000 mg/200 mL premix     1,000 mg 200 mL/hr over 60 Minutes Intravenous Every 12 hours 10/14/15 0603 10/14/15 1811   10/14/15 0315  vancomycin (VANCOCIN) IVPB 1000 mg/200 mL premix     1,000 mg 200 mL/hr over 60 Minutes Intravenous  Once 10/14/15 0305 10/14/15 0416  Code Status:     Code Status Orders        Start     Ordered   10/14/15 0524  Do not attempt resuscitation (DNR)   Continuous    Question Answer Comment  In the event of cardiac or respiratory ARREST Do not call a "code blue"   In the event of cardiac or respiratory ARREST Do not perform Intubation, CPR, defibrillation or ACLS   In the event of cardiac or respiratory ARREST Use medication by any route, position, wound care, and other measures to relive pain and suffering. May use oxygen, suction and manual treatment of airway obstruction as needed for comfort.      10/14/15 0523    Code Status History    Date Active Date Inactive Code Status Order ID Comments User Context   10/14/2015  3:41 AM 10/14/2015  5:23 AM DNR [id number]  Lorretta Harp, MD ED    Advance Directive Documentation        Most Recent Value   Type of Advance Directive  Healthcare Power of Attorney, Living will   Pre-existing out of facility DNR order (yellow form or pink MOST form)     "MOST" Form in Place?       Family Communication: daughter Theda Sers- daily discussions  Disposition Plan: will need SNF but daughter willing to take home in next 2 days  DVT prophylaxis: Heparin Consultants: ortho Procedures:  2 D  eCHO   Objective: Filed Weights   10/17/15 0404 10/18/15 0617 10/20/15 1956  Weight: 68.5 kg (151 lb 0.2 oz) 77.3 kg (170 lb 6.7 oz) 70 kg (154 lb 5.2 oz)    Intake/Output Summary (Last 24 hours) at 10/21/15 1636 Last data filed at 10/21/15 1101  Gross per 24 hour  Intake 1396.67 ml  Output   2200 ml  Net -803.33 ml     Vitals Filed Vitals:   10/21/15 0440 10/21/15 0753 10/21/15 0818 10/21/15 1130  BP: 141/70 149/74  155/88  Pulse: 74   79  Temp: 98.2 F (36.8 C)   97.5 F (36.4 C)  TempSrc: Axillary   Oral  Resp: 22   24  Height:      Weight:      SpO2: 96% 96% 93% 98%    Exam:  General:  Pt is sleepy but more communicative than yesterday  HEENT: No icterus, No thrush, oral mucosa moist  Cardiovascular: regular rate and rhythm, S1/S2 No murmur  Respiratory: clear to auscultation bilaterally   Abdomen: Soft, +Bowel sounds, non tender, non distended, no guarding  MSK: No cyanosis or clubbing- no pedal edema- left leg in splint    Data Reviewed: Basic Metabolic Panel:  Recent Labs Lab 10/16/15 0528 10/17/15 0324 10/18/15 0449 10/19/15 1030 10/21/15 0415  NA 137 135 137 139 137  K 3.7 3.9 3.9 3.5 2.8*  CL 104 102 103 107 102  CO2 GLUCOSE 218* 235* 245* 200* 201*  BUN CREATININE 0.69 0.78 0.77 0.72 0.74  CALCIUM 8.5* 8.1* 8.2* 7.8* 8.0*   Liver Function Tests: No results for input(s): AST, ALT, ALKPHOS, BILITOT, PROT, ALBUMIN in the last 168 hours. No results for input(s): LIPASE, AMYLASE in the last 168 hours.  Recent Labs Lab 10/18/15 1738  AMMONIA 25   CBC:  Recent Labs Lab 10/17/15 0324 10/18/15 0449 10/19/15 1030 10/20/15 0315 10/21/15 0415  WBC 10.2 10.2 9.5 10.2 9.4  HGB 8.9* 8.6* 8.6*  8.5* 8.9*  HCT 27.6* 27.3* 27.6* 28.3* 29.1*  MCV 77.7* 80.1 80.7 80.9 80.2  PLT 206 210 269 306 363   Cardiac Enzymes: No results for input(s): CKTOTAL, CKMB, CKMBINDEX, TROPONINI in the last 168 hours. BNP (last  3 results)  Recent Labs  10/14/15 0535  BNP 141.5*    ProBNP (last 3 results) No results for input(s): PROBNP in the last 8760 hours.  CBG:  Recent Labs Lab 10/20/15 1128 10/20/15 1604 10/20/15 2130 10/21/15 0706 10/21/15 1128  GLUCAP 185* 173* 169* 219* 218*    Recent Results (from the past 240 hour(s))  Surgical pcr screen     Status: None   Collection Time: 10/15/15 10:20 PM  Result Value Ref Range Status   MRSA, PCR NEGATIVE NEGATIVE Final   Staphylococcus aureus NEGATIVE NEGATIVE Final    Comment:        The Xpert SA Assay (FDA approved for NASAL specimens in patients over 89 years of age), is one component of a comprehensive surveillance program.  Test performance has been validated by [facility] for patients greater than or equal to 63 year old. It is not intended to diagnose infection nor to guide or monitor treatment.      Studies: No results found.  Scheduled Meds:  Scheduled Meds: . antiseptic oral rinse  7 mL Mouth Rinse BID  . atorvastatin  40 mg Oral q1800  . citalopram  10 mg Oral Daily  . dorzolamide  1 drop Both Eyes BID   And  . timolol  1 drop Both Eyes BID  . feeding supplement (ENSURE ENLIVE)  237 mL Oral BID BM  . insulin aspart  0-15 Units Subcutaneous TID WC  . insulin aspart  0-5 Units Subcutaneous QHS  . megestrol  400 mg Oral BID  . mirtazapine  15 mg Oral QHS  . pantoprazole  40 mg Oral Daily  . senna-docusate  2 tablet Oral QHS  . tiotropium  18 mcg Inhalation Daily  . verapamil  40 mg Oral BID   Continuous Infusions: . sodium chloride 50 mL/hr at 10/20/15 2156  . heparin 1,100 Units/hr (10/20/15 2300)    Time spent on care of this patient: 35 min   Asha Grumbine, MD 10/21/2015, 4:36 PM  LOS: 7 days   [facility] Office  [phone] Pager - Text Page per [email or web address] If 7PM-7AM, please contact night-coverage [email or web address]

## 2015-10-21 NOTE — Progress Notes (Signed)
   Subjective:  Patient reports pain as mild to moderate.  Patient says she wants to go home. LLE doppler showed DVT.  Objective:   VITALS:   Filed Vitals:   10/20/15 1956 10/20/15 2334 10/20/15 2338 10/21/15 0440  BP: 151/95  148/99 141/70  Pulse: 97  89 74  Temp:  98.6 F (37 C)  98.2 F (36.8 C)  TempSrc:  Axillary  Axillary  Resp: Height:  (1.6 m)     Weight: 70 kg (154 lb 5.2 oz)     SpO2: 92%  93% 96%    ABD soft Sensation intact distally Intact pulses distally Dorsiflexion/Plantar flexion intact Incision: dressing C/D/I Compartment soft Knee immobilizer in place  Lab Results  Component Value Date   WBC 9.4 10/21/2015   HGB 8.9* 10/21/2015   HCT 29.1* 10/21/2015   MCV 80.2 10/21/2015   PLT 363 10/21/2015   BMET    Component Value Date/Time   NA 137 10/21/2015 0415   K 2.8* 10/21/2015 0415   CL 102 10/21/2015 0415   CO2 24 10/21/2015 0415   GLUCOSE 201* 10/21/2015 0415   BUN 8 10/21/2015 0415   CREATININE 0.74 10/21/2015 0415   CALCIUM 8.0* 10/21/2015 0415   GFRNONAA >60 10/21/2015 0415   GFRAA >60 10/21/2015 0415     Assessment/Plan: 5 Days Post-Op   Principal Problem:   Open femur fracture, left (HCC) Active Problems:   Diabetes mellitus without complication (HCC)   COPD (chronic obstructive pulmonary disease) (HCC)   Atrial fibrillation (HCC)   CHF (congestive heart failure) (HCC)   HLD (hyperlipidemia)   GERD (gastroesophageal reflux disease)   Femur fracture, left (HCC)   Fall   Chronic obstructive pulmonary disease (HCC)   Open fracture of left femur, type I or II, initial encounter   Cerebral thrombosis with cerebral infarction   CVA (cerebral infarction)   Pulmonary emboli (HCC)   Knee immobilizer at all times NWB LLE DVT/PE: per primary team PT/OT Avoid narcotics Dispo: d/c planning   [name] [name], [name] 10/21/2015, 7:33 AM   Samson Frederic, MD Cell [phone]

## 2015-10-22 ENCOUNTER — Inpatient Hospital Stay (HOSPITAL_COMMUNITY): Payer: Medicare Other

## 2015-10-22 DIAGNOSIS — S72402A Unspecified fracture of lower end of left femur, initial encounter for closed fracture: Secondary | ICD-10-CM

## 2015-10-22 DIAGNOSIS — K219 Gastro-esophageal reflux disease without esophagitis: Secondary | ICD-10-CM

## 2015-10-22 LAB — BASIC METABOLIC PANEL
Anion gap: 14 (ref 5–15)
BUN: 8 mg/dL (ref 6–20)
CALCIUM: 8.3 mg/dL — AB (ref 8.9–10.3)
CHLORIDE: 103 mmol/L (ref 101–111)
CO2: 20 mmol/L — ABNORMAL LOW (ref 22–32)
CREATININE: 0.69 mg/dL (ref 0.44–1.00)
GFR calc non Af Amer: >60 mL/min (ref >60)
Glucose, Bld: 170 mg/dL — ABNORMAL HIGH (ref 65–99)
Potassium: 3.8 mmol/L (ref 3.5–5.1)
SODIUM: 137 mmol/L (ref 135–145)

## 2015-10-22 LAB — GLUCOSE, CAPILLARY
GLUCOSE-CAPILLARY: 244 mg/dL — AB (ref 65–99)
GLUCOSE-CAPILLARY: 108 mg/dL — AB (ref 65–99)
GLUCOSE-CAPILLARY: 117 mg/dL — AB (ref 65–99)
Glucose-Capillary: 165 mg/dL — ABNORMAL HIGH (ref 65–99)

## 2015-10-22 LAB — CBC
HCT: 32.3 % — ABNORMAL LOW (ref 36.0–46.0)
HEMOGLOBIN: 10 g/dL — AB (ref 12.0–15.0)
MCH: 25.3 pg — ABNORMAL LOW (ref 26.0–34.0)
MCHC: 31 g/dL (ref 30.0–36.0)
MCV: 81.6 fL (ref 78.0–100.0)
Platelets: 345 10*3/uL (ref 150–400)
RBC: 3.96 MIL/uL (ref 3.87–5.11)
RDW: 20.5 % — ABNORMAL HIGH (ref 11.5–15.5)
WBC: 13.3 10*3/uL — AB (ref 4.0–10.5)

## 2015-10-22 LAB — MAGNESIUM: MAGNESIUM: 1.8 mg/dL (ref 1.7–2.4)

## 2015-10-22 LAB — HEPARIN LEVEL (UNFRACTIONATED): HEPARIN UNFRACTIONATED: 0.34 [IU]/mL (ref 0.30–0.70)

## 2015-10-22 MED ORDER — HYDRALAZINE HCL 20 MG/ML IJ SOLN: 5.0000 mg | Freq: Once | INTRAMUSCULAR | Status: DC

## 2015-10-22 MED ORDER — OLANZAPINE 2.5 MG PO TABS
2.5000 mg | ORAL_TABLET | Freq: Every evening | ORAL | Status: DC | PRN
  Filled 2015-10-22: qty 1

## 2015-10-22 NOTE — Progress Notes (Signed)
ANTICOAGULATION CONSULT NOTE - Follow up Consult  Pharmacy Consult for heparin Indication: pulmonary embolus and stroke    Patient Measurements: Height:  (160 cm) Weight: 155 lb 10.3 oz (70.6 kg) IBW/kg (Calculated) : 52.4 Heparin Dosing Weight: 70kg  Vital Signs: Temp: 98.8 F (37.1 C) (02/01 0826) Temp Source: Oral (02/01 0826) BP: 161/95 mmHg (02/01 0800) Pulse Rate: 69 (02/01 0800)  Labs:  Recent Labs  10/19/15 1030  10/19/15 1930 10/20/15 0315 10/21/15 0415 10/22/15 0617 10/22/15 0625  HGB 8.6*  --   --  8.5* 8.9*  --  10.0*  HCT 27.6*  --   --  28.3* 29.1*  --  32.3*  PLT 269  --   --  306 363  --  345  APTT  --   < > 70* 69* 80*  --   --   HEPARINUNFRC  --   < >  --  0.63 0.44 0.34  --   CREATININE 0.72  --   --   --  0.74  --  0.69  < > = values in this interval not displayed.  Estimated Creatinine Clearance: 43.2 mL/min (by C-G formula based on Cr of 0.69).  Assessment: 82yo female in unfortunate situation s/p stroke after surgery, now w/ PE confirmed on CT w/ signs of RHS, has been on Xarelto for secondary stroke prevention but probably not a good candidate for full-dose Xarelto for PE, to transition to heparin for now. Last xarelto dose 1/28 @ 1054.  Heparin level 0.34 today  Goal of Therapy:  Heparin level 0.3-0.5 units/ml aPTT 66-84 seconds Monitor platelets by anticoagulation protocol: Yes   Plan:  Will continue heparin at 1100 units/hr for now Daily heparin level, CBC  Thank you for allowing [place] to participate in this patients care. Signe Colt, PharmD Pager: [phone]  10/22/2015,9:57 AM

## 2015-10-22 NOTE — Progress Notes (Signed)
Speech Language Pathology Treatment: Dysphagia  Patient Details Name: [name] [name] MRN: [id number] DOB: 19-May-1922 Today's Date: 10/22/2015 Time: 0932-1029 SLP Time Calculation (min) (ACUTE ONLY): 57 min  Assessment / Plan / Recommendation Clinical Impression  Pt seen for f/u after recommendation for MBS on previous date; however, palliative care note from later that day states that the pt/family would not want any modified diets. Daughter at this point says she would still like this testing done to determine if her mother is aspirating or not. SLP observed several small bites of puree and cup sips of thin liquid, all of which triggered strong, reflexive coughing suggestive of aspiration.  Discussed with pt/daughter that the point of performing a swallowing study at this point would not be to determine if she is aspirating, as that is highly likely given clinical presentation. Rather, the goal would be to determine if diet modifications would make PO intake safer. This seems out of line with goals established during palliative care encounter. Also, if pt is having overt signs of aspiration and/or penetration with thin liquids and purees, then thickened liquids would also still pose a risk. At that point, she would also be aspirating the thickener and she would be at greater risk for malnutrition/dehydration.   Lengthy discussion was had with daughter, who clearly wants to give her mother all of the best options to heal that she can. After discussing the goals of what further testing would be (as well as the risks of what a modified diet could entail), she is unsure whether she wants to proceed with testing. Given the pt's level of lethargy this morning, she is not appropriate for testing at this time anyway. Pt's daughter requested that we return on next date to allow her more time to think about her decision and to give the pt time to rest. Encouraged pt to discuss this further with her palliative care  team as well. SLP provided education about careful hand feeding and aspiration and esophageal precautions to reduce the risk of aspiration with current diet.    HPI HPI: 82 y.o. female hx of A fib, DM, HTN admitted 1/24 after a fall in which she suffered a left leg distal femur fracture, s/p ORIF left femur on 1/26. AMS noted after procedure, prompting an MRI that showed acute infarct in the left cerebellum, left PCA territory involving posterior medial temporal lobe and occipital lobe and multiple infarcts in left parietal lobe. Daughter says that pt coughs frequently on food and liquids PTA, and that this has been a more chronic problem. She also says pt has a h/o reflux.      SLP Plan  Continue with current plan of care     Recommendations  Diet recommendations: Dysphagia 1 (puree);Thin liquid Liquids provided via: Cup Medication Administration: Crushed with puree Supervision: Full supervision/cueing for compensatory strategies Compensations: Minimize environmental distractions;Slow rate;Small sips/bites Postural Changes and/or Swallow Maneuvers: Seated upright 90 degrees;Out of bed for meals             Oral Care Recommendations: Oral care QID Follow up Recommendations: Skilled Nursing facility Plan: Continue with current plan of care     GO               Maxcine Ham, M.A. CCC-SLP [phone]  Maxcine Ham 10/22/2015, 10:50 AM

## 2015-10-22 NOTE — Progress Notes (Signed)
Daily Progress Note   Patient Name: [name] [name]       Date: 10/22/2015 DOB: 1922/02/02  Age: 82 y.o. MRN#: [id number] Attending Physician: Cathren Harsh, MD Primary Care Physician: [name], MD Admit Date: 10/14/2015  Reason for Consultation/Follow-up: Establishing goals of care, Hospice Evaluation, Non pain symptom management and Psychosocial/spiritual support  Subjective: [name] looks like she is declining today. She is much less responsive. Minimal PO intake. [name] [name] recognizes this too and is concerned. She asks me if "we may need to make another decision"- she is possibly considering a comfort care approach- especially if additional complications start to arise ie; PNA, UTI, no PO intake. [name] is taking pills with applesauce per nursing. [name] [name] thinks she may actually just be sleeping because she is exhausted and has now gotten pain relief with a more palliative approach and is able to rest. Plan for now is to still go to [facility] for rehab, but this may change if she gets worse. Noted temp 99.0. She is coughing while I am in the room.  Interval Events: PMT consult 1/31 Length of Stay: 8 days  Current Medications: Scheduled Meds:  . antiseptic oral rinse  7 mL Mouth Rinse BID  . dorzolamide  1 drop Both Eyes BID   And  . timolol  1 drop Both Eyes BID  . feeding supplement (ENSURE ENLIVE)  237 mL Oral BID BM  . hydrALAZINE  5 mg Intravenous Once  . insulin aspart  0-15 Units Subcutaneous TID WC  . linagliptin  5 mg Oral Daily  . LORazepam  0.5 mg Intravenous Once  . mirtazapine  15 mg Oral QHS  . pantoprazole  40 mg Oral Daily  . senna-docusate  2 tablet Oral QHS  . verapamil  40 mg Oral BID    Continuous Infusions: . sodium chloride 50 mL/hr at 10/22/15 2300  .  heparin 1,100 Units/hr (10/22/15 2300)    PRN Meds: acetaminophen **OR** acetaminophen, ipratropium-albuterol, menthol-cetylpyridinium **OR** phenol, metoCLOPramide **OR** metoCLOPramide (REGLAN) injection, ondansetron **OR** ondansetron (ZOFRAN) IV, oxyCODONE, polyvinyl alcohol  Physical Exam: Physical Exam              Vital Signs: BP 126/87 mmHg  Pulse 98  Temp(Src) 98 F (36.7 C) (Oral)  Resp 24  Ht 5\' 3"  (1.6 m)  Wt  70.6 kg (155 lb 10.3 oz)  BMI 27.58 kg/m2  SpO2 94% SpO2: SpO2: 94 % O2 Device: O2 Device: Nasal Cannula O2 Flow Rate: O2 Flow Rate (L/min): 2 L/min  Intake/output summary:  Intake/Output Summary (Last 24 hours) at 10/22/15 2334 Last data filed at 10/22/15 2300  Gross per 24 hour  Intake   1724 ml  Output   2325 ml  Net   -601 ml   LBM: Last BM Date: 10/21/15 Baseline Weight: Weight: 67.132 kg (148 lb) Most recent weight: Weight: 70.6 kg (155 lb 10.3 oz)       Palliative Assessment/Data: Flowsheet Rows        Most Recent Value   Intake Tab    Referral Department  Hospitalist   Unit at Time of Referral  Intermediate Care Unit   Palliative Care Primary Diagnosis  Cardiac   Date Notified  10/21/15   Palliative Care Type  New Palliative care   Reason for referral  Clarify Goals of Care   Date of Admission  10/14/15   Date first seen by Palliative Care  10/21/15   # of days IP prior to Palliative referral  7   Clinical Assessment    Palliative Performance Scale Score  20%   Pain Max last 24 hours  Not able to report   Pain Min Last 24 hours  Not able to report   Dyspnea Max Last 24 Hours  Not able to report   Dyspnea Min Last 24 hours  Not able to report   Nausea Max Last 24 Hours  Not able to report   Nausea Min Last 24 Hours  Not able to report   Anxiety Max Last 24 Hours  Not able to report   Anxiety Min Last 24 Hours  Not able to report   Other Max Last 24 Hours  Not able to report   Psychosocial & Spiritual Assessment    Palliative Care  Outcomes    Patient/Family meeting held?  Yes   Who was at the meeting?  Primitivo Gauze   Palliative Care Outcomes  Improved pain interventions, Clarified goals of care, Provided psychosocial or spiritual support, Counseled regarding hospice, Provided advance care planning   Palliative Care follow-up planned  Yes, Facility   Other Treatment Preference Instructions  Follow-up at [facility]      Additional Data Reviewed: CBC    Component Value Date/Time   WBC 13.3* 10/22/2015 0625   RBC 3.96 10/22/2015 0625   HGB 10.0* 10/22/2015 0625   HCT 32.3* 10/22/2015 0625   PLT 345 10/22/2015 0625   MCV 81.6 10/22/2015 0625   MCH 25.3* 10/22/2015 0625   MCHC 31.0 10/22/2015 0625   RDW 20.5* 10/22/2015 0625    CMP     Component Value Date/Time   NA 137 10/22/2015 0625   K 3.8 10/22/2015 0625   CL 103 10/22/2015 0625   CO2 20* 10/22/2015 0625   GLUCOSE 170* 10/22/2015 0625   BUN 8 10/22/2015 0625   CREATININE 0.69 10/22/2015 0625   CALCIUM 8.3* 10/22/2015 0625   PROT 5.7* 10/14/2015 0535   ALBUMIN 3.0* 10/14/2015 0535   AST 18 10/14/2015 0535   ALT 12* 10/14/2015 0535   ALKPHOS 54 10/14/2015 0535   BILITOT 0.7 10/14/2015 0535   GFRNONAA >60 10/22/2015 0625   GFRAA >60 10/22/2015 0625       Problem List:  Patient Active Problem List   Diagnosis Date Noted  . Paroxysmal atrial fibrillation (HCC)   .  Cerebral thrombosis with cerebral infarction 10/19/2015  . CVA (cerebral infarction)   . Pulmonary emboli (HCC)   . Open fracture of left femur, type I or II, initial encounter 10/16/2015  . Open femur fracture, left (HCC) 10/14/2015  . Femur fracture, left (HCC) 10/14/2015  . Diabetes mellitus without complication (HCC)   . COPD (chronic obstructive pulmonary disease) (HCC)   . Atrial fibrillation (HCC)   . CHF (congestive heart failure) (HCC)   . HLD (hyperlipidemia)   . GERD (gastroesophageal reflux disease)   . Fall   . Chronic obstructive pulmonary disease [facility])       Palliative Care Assessment & Plan   [name] [name] does not appear to be doing well today. Per nursing she is able to wake up and take her oral medications in applesauce. Daughter is very clear with me today that if her other CANNOT rehab or get better that she would not want her life prolonged in a debilitated state of health- she is however also clear that she wants to give her mother every best chance to improve along with a more palliative approach that reduces pain and suffering.  1. Transition her off heparin drip and transition back to [place]- she is taking all of her oral medications in applesauce with no difficulty. 2. Cough and low grade temp- her daughter is asking for a CXR. Mostly for prognostic information and high concern for complication like PNA developing given her aspiration and immobility. 3. In terms of swallowing we probably don't need to put her through a formal barium swallow evaluation- we will not formally modify her diet for comfort, and allow her to have whatever is most comfortable and desirable to her- her aspiration risk will remain, no artifical feeding- probably multifactorial with no clear reversible etiology other than deconditioning, stroke and preexisting issues with reflux and choking on food occasionally.   1.Code Status:  DNR    Code Status Orders        Start     Ordered   10/21/15 1718  Do not attempt resuscitation (DNR)   Continuous    Question Answer Comment  In the event of cardiac or respiratory ARREST Do not call a "code blue"   In the event of cardiac or respiratory ARREST Do not perform Intubation, CPR, defibrillation or ACLS   In the event of cardiac or respiratory ARREST Use medication by any route, position, wound care, and other measures to relive pain and suffering. May use oxygen, suction and manual treatment of airway obstruction as needed for comfort.      10/21/15 1719    Code Status History    Date Active Date Inactive Code  Status Order ID Comments User Context   10/21/2015  5:15 PM 10/21/2015  5:19 PM DNR [id number]  Edsel Petrin, DO Inpatient   10/19/2015  3:28 AM 10/21/2015  5:14 PM Partial Code [id number]  Ramond Marrow, DO Inpatient   10/16/2015  1:24 PM 10/19/2015  3:28 AM DNR [id number]  Samson Frederic, MD Inpatient   10/14/2015  5:23 AM 10/16/2015  1:24 PM DNR [id number]  Venita Lick, MD Inpatient   10/14/2015  3:41 AM 10/14/2015  5:23 AM DNR [id number]  Lorretta Harp, MD ED    Advance Directive Documentation        Most Recent Value   Type of Advance Directive  Healthcare Power of Attorney, Living will   Pre-existing out of facility DNR order (yellow form or pink MOST form)     "  MOST" Form in Place?         2. Goals of Care/Additional Recommendations:  Limitations on Scope of Treatment: Avoid Hospitalization and Minimize Medications  Desire for further Chaplaincy support:yes  Psycho-social Needs: Caregiving  Support/Resources and Education on Hospice  3. Symptom Management:  Continue scheduled Tylenol, careful hand feeding and aspiration precautions- low dose oxycodone prn  Magic mouthwash PRN   PRN xyprexa for PM agitation    4. Palliative Prophylaxis:   Aspiration, Bowel Regimen, Delirium Protocol, Frequent Pain Assessment, Oral Care and Turn Reposition  5. Prognosis: Poor prognosis given complications and severity of her fracture- <6 months- possible much less- we discussed hospice care in detail.  6. Discharge Planning:  Skilled Nursing Facility for rehab with Palliative care service follow-up   Care plan was discussed with [name] Dolly, RN.  Thank you for allowing the Palliative Medicine Team to assist in the care of this patient.   Time In: 2:45 Time Out: 3:30 Total Time 45 minutes Prolonged Time Billed no        Edsel Petrin, DO  10/22/2015, 11:34 PM  Please contact Palliative Medicine Team phone at [phone] for questions and concerns.

## 2015-10-22 NOTE — Progress Notes (Signed)
   Subjective:  Patient reports pain as mild to moderate.  Poor PO intake - palliative consult obtained.  Objective:   VITALS:   Filed Vitals:   10/22/15 0237 10/22/15 0414 10/22/15 0439 10/22/15 0500  BP:   149/101 138/92  Pulse:   80   Temp:  98.1 F (36.7 C)    TempSrc:  Oral    Resp: Height:      Weight:      SpO2: 92%  95% 97%    ABD soft Sensation intact distally Intact pulses distally Dorsiflexion/Plantar flexion intact Incision: dressing C/D/I Compartment soft Knee immobilizer in place  Lab Results  Component Value Date   WBC 9.4 10/21/2015   HGB 8.9* 10/21/2015   HCT 29.1* 10/21/2015   MCV 80.2 10/21/2015   PLT 363 10/21/2015   BMET    Component Value Date/Time   NA 137 10/21/2015 0415   K 2.8* 10/21/2015 0415   CL 102 10/21/2015 0415   CO2 24 10/21/2015 0415   GLUCOSE 201* 10/21/2015 0415   BUN 8 10/21/2015 0415   CREATININE 0.74 10/21/2015 0415   CALCIUM 8.0* 10/21/2015 0415   GFRNONAA >60 10/21/2015 0415   GFRAA >60 10/21/2015 0415     Assessment/Plan: 6 Days Post-Op   Principal Problem:   Open femur fracture, left (HCC) Active Problems:   Diabetes mellitus without complication (HCC)   COPD (chronic obstructive pulmonary disease) (HCC)   Atrial fibrillation (HCC)   CHF (congestive heart failure) (HCC)   HLD (hyperlipidemia)   GERD (gastroesophageal reflux disease)   Femur fracture, left (HCC)   Fall   Chronic obstructive pulmonary disease (HCC)   Open fracture of left femur, type I or II, initial encounter   Cerebral thrombosis with cerebral infarction   CVA (cerebral infarction)   Pulmonary emboli (HCC)   Paroxysmal atrial fibrillation (HCC)   Knee immobilizer at all times NWB LLE DVT/PE: per primary team PT/OT Avoid narcotics Dispo: d/c planning   [name] [name], [name] 10/22/2015, 5:40 AM   Samson Frederic, MD Cell [phone]

## 2015-10-22 NOTE — Progress Notes (Signed)
[facility]                                                                              Patient Demographics  [name] [name], is a 82 y.o. female, DOB - 06-16-1922, ZOX:[id number]  Admit date - 10/14/2015   Admitting Physician Lorretta Harp, MD  Outpatient Primary MD for the patient is [name], MD  LOS - 8   Chief Complaint  Patient presents with  . Fall  . Trauma       Brief HPI   [name] [name] is a 82 y.o. female PMH of af-fib on Xarelto, HTN, COPD, DM, s/p bilateral knee replacement by [name] 5-7 years ago, GERD, depression, congestive heart failure, who presents with left knee pain after fall. She was found to have a periprosthetic knee fracture. She was doing well up until after ORIF on 1/26 when she failed to awaken- further work up was done and this unfortunately revealed that she had developed multifocal infarcts and Pulmonary emboli in the short duration that she was off of Xarelto. She has since been slow to recover mentally and has had very poor PO intake which is now the limiting factor in her being discharged.    Assessment & Plan    Principal Problem: Open periprosthetic femur fracture, left - went to OR for washing on 1/24 and then underwent ORIF on 1/26 - management per ortho- will need to be non-weight bearing for a number of weeks to allow the knee to heal - Appreciate palliative medicine recommendations. I discussed in detail with patient's daughter at the bedside. She is agreeable to skilled nursing facility if patient has meaningful recovery in the next few days. However she will take her home with hospice if she declines.  Lethargy- multifocal infarcts/acute CVAs, largely involving left PCA territory, but also left cerebellar, right PCA, left MCA,, in the setting of atrial fibrillation, embolic - Awakening very little after OR on 1/26 - CT head showed acute infarction, MRI of the brain on 1/28 showed acute infarction in the left cerebellum,  acute infarction through out the left posterior cerebral artery territory affecting the posterior medial temporal lobe and occipital lobe with focal involvement of the lateral thalamus, additional acute infarction in the left parietal cortical and subcortical brain, additional subcm infarction in the left posterior frontal deep white matter . Infarcts likely related to A-fib and occurred in the short duration she was off of Xarelto awaiting surgery, does not appear to have focal deficits in arms or right leg, has right hemianopia  -- no infection noted on UA, ammonia level normal - Continue IV fluids for now - 2-D echo showed EF of 55-60%, grade 1 diastolic dysfunction, no PFO, carotid Dopplers showed 1-39% ICA stenosis right and left ICA - Neurology recommended NOAC's once tolerated, currently on heparin drip   Acute hypoxic resp failure due to b/l PE --noted to be hypoxic after surgery - CXR did not reveal an etiology, CT chest showed Subacute b/l PEs  - despite being on DVT prophylaxis, PE's occurred in the short period she was off of anticoagulation . Patient has history of b/l PE  in the past as well even while on Pradaxa - have ordred hypercoag panel (mainly to see if she has a genetic predisposition that could be inherited by her family) - will not order Protein C, S, Anti-thrombin as these will be inaccurate in setting of acute PE and heparin use - Lupus anticoagulation slightly elevated,would need to repeat in 4 months  - venous duplex reveals left leg DVT - hemodynamically stable- no dyspnea or chest pain but requiring 4-6 L of O2 - on Heparin drip, will transition to Xarelto once alert and oriented, tolerating diet   Dehydration, anorexia - poor oral intake, continue gentle hydration - Speech therapy following, appreciate palliative medicine recommendations - Continue dysphagia 1 Diet   Chronic diastolic CHF (congestive heart failure) (HCC):  - 2-D echo showed EF of 55-60% with  grade 1 diastolic dysfunction - CHF is compensated.  - Hold Lasix for now as she is compensated and she has minimal PO intake of fluid  Anemia due to acute blood loss - Hb dropped from 12 to 8.9 (due to acute blood loss from knee fracture?) given 1 U PRBC by ortho prior to surgery  - H/H stable  HTN -Currently stable  Leukocytosis:  - Likely due to stress-induced demargination, cannot rule out aspiration - Currently not spiking any fevers  Atrial Fibrillation:  CHA2DS2-VASc Score is 6- Patient was on Xarelto at home.  - resumed Xarelto on the day after surgery as recommended by Ortho but due to acute PE's heparin has been started   DM-II:  - Last A1c not on record. Patient is taking metformin and Januvia at home - SSI while here - A1c is 8.3  COPD:  - stable. -Continue Xopenex inhaler, Spiriva inhaler  HLD: - Last LDL was not on record- current LDL is 121 -Continue home medications: Pravastatin  GERD: -Protonix  Depression:  -Stable, no suicidal or homicidal ideations. -Continue home medications: Celexa    Code Status: DO NOT RESUSCITATE  Family Communication: Discussed in detail with the patient, all imaging results, lab results explained to the patient's daughter at the bedside  Disposition Plan:  Time Spent in minutes 25 minutes  Procedures  CT head MRI brain 2-D echo Carotid Dopplers  Consults   Neurology Palliative medicine Orthopedics  DVT Prophylaxis  heparin drip  Medications  Scheduled Meds: . antiseptic oral rinse  7 mL Mouth Rinse BID  . dorzolamide  1 drop Both Eyes BID   And  . timolol  1 drop Both Eyes BID  . feeding supplement (ENSURE ENLIVE)  237 mL Oral BID BM  . hydrALAZINE  5 mg Intravenous Once  . insulin aspart  0-15 Units Subcutaneous TID WC  . linagliptin  5 mg Oral Daily  . LORazepam  0.5 mg Intravenous Once  . mirtazapine  15 mg Oral QHS  . pantoprazole  40 mg Oral Daily  . senna-docusate  2 tablet Oral QHS    . verapamil  40 mg Oral BID   Continuous Infusions: . sodium chloride 50 mL/hr at 10/22/15 1158  . heparin 1,100 Units/hr (10/22/15 0600)   PRN Meds:.acetaminophen **OR** acetaminophen, ipratropium-albuterol, menthol-cetylpyridinium **OR** phenol, metoCLOPramide **OR** metoCLOPramide (REGLAN) injection, ondansetron **OR** ondansetron (ZOFRAN) IV, oxyCODONE, polyvinyl alcohol   Antibiotics   Anti-infectives    Start     Dose/Rate Route Frequency Ordered Stop   10/17/15 0600  vancomycin (VANCOCIN) IVPB 750 mg/150 ml premix     750 mg 150 mL/hr over 60 Minutes Intravenous Every 24 hours 10/16/15  1411 10/17/15 0753   10/16/15 2000  vancomycin (VANCOCIN) IVPB 1000 mg/200 mL premix  Status:  Discontinued     1,000 mg 200 mL/hr over 60 Minutes Intravenous Every 12 hours 10/16/15 1324 10/16/15 1411   10/16/15 0845  clindamycin (CLEOCIN) IVPB 600 mg     600 mg 100 mL/hr over 30 Minutes Intravenous  Once 10/16/15 0833 10/16/15 0756   10/15/15 0815  vancomycin (VANCOCIN) IVPB 1000 mg/200 mL premix     1,000 mg 200 mL/hr over 60 Minutes Intravenous To [facility] 10/15/15 0800 10/16/15 0816   10/14/15 1500  vancomycin (VANCOCIN) IVPB 1000 mg/200 mL premix     1,000 mg 200 mL/hr over 60 Minutes Intravenous Every 12 hours 10/14/15 0603 10/14/15 1811   10/14/15 0315  vancomycin (VANCOCIN) IVPB 1000 mg/200 mL premix     1,000 mg 200 mL/hr over 60 Minutes Intravenous  Once 10/14/15 0305 10/14/15 0416        Subjective:   Evalise Abruzzese was seen and examined today. Sleepy but arousable, more responsive to daughter's questions. Unable to obtain any review of systems from the patient. No acute issues overnight    Objective:   Blood pressure 131/81, pulse 50, temperature 97.4 F (36.3 C), temperature source Oral, resp. rate 27, height 5\' 3"  (1.6 m), weight 70.6 kg (155 lb 10.3 oz), SpO2 99 %.  Wt Readings from Last 3 Encounters:  10/22/15 70.6 kg (155 lb 10.3 oz)     Intake/Output  Summary (Last 24 hours) at 10/22/15 1325 Last data filed at 10/22/15 1153  Gross per 24 hour  Intake 1056.12 ml  Output   2175 ml  Net -1118.88 ml    Exam  General: Lethargic but arousable, NAD, more communicative to patient's daughter  HEENT:  PERRLA, EOMI, Anicteric Sclera, mucous membranes moist.   Neck: Supple, no JVD, no masses  CVS: S1 S2 auscultated, no rubs, murmurs or gallops. Regular rate and rhythm.  Respiratory: Clear to auscultation bilaterally, no wheezing, rales or rhonchi  Abdomen: Soft, nontender, nondistended, + bowel sounds  Ext: no cyanosis clubbing. Left leg in immobilizer   Neuro: not following commands  Skin: No rashes  Psych: sleepy but arousable, more communicative   Data Review   Micro Results Recent Results (from the past 240 hour(s))  Surgical pcr screen     Status: None   Collection Time: 10/15/15 10:20 PM  Result Value Ref Range Status   MRSA, PCR NEGATIVE NEGATIVE Final   Staphylococcus aureus NEGATIVE NEGATIVE Final    Comment:        The Xpert SA Assay (FDA approved for NASAL specimens in patients over 38 years of age), is one component of a comprehensive surveillance program.  Test performance has been validated by [facility] for patients greater than or equal to 10 year old. It is not intended to diagnose infection nor to guide or monitor treatment.     Radiology Reports Ct Head Wo Contrast  10/14/2015  CLINICAL DATA:  Witnessed fall at home. Did not hit head. Left lower femoral fracture. EXAM: CT HEAD WITHOUT CONTRAST CT CERVICAL SPINE WITHOUT CONTRAST TECHNIQUE: Multidetector CT imaging of the head and cervical spine was performed following the standard protocol without intravenous contrast. Multiplanar CT image reconstructions of the cervical spine were also generated. COMPARISON:  None. FINDINGS: CT HEAD FINDINGS Diffuse cerebral atrophy. Ventricular dilatation consistent with central atrophy. Low-attenuation changes in  the deep white matter consistent with small vessel ischemia. No mass effect or midline  shift. No abnormal extra-axial fluid collections. Gray-white matter junctions are distinct. Basal cisterns are not effaced. No evidence of acute intracranial hemorrhage. No depressed skull fractures. Partial opacification of some of the mastoid air cells bilaterally. Visualized paranasal sinuses are not opacified. Vascular calcifications. CT CERVICAL SPINE FINDINGS Diffuse bone demineralization. Normal alignment of the cervical spine. Degenerative changes throughout with narrowed cervical interspaces and associated endplate hypertrophic changes. Degenerative changes in the facet joints. No vertebral compression deformities. No prevertebral soft tissue swelling. Benign-appearing cystic lesion in the right lateral mass of C5. C1-2 articulation appears intact. Interstitial infiltrates in the lung apices may indicate edema. Soft tissues are unremarkable. Vascular calcifications. IMPRESSION: No acute intracranial abnormalities. Chronic atrophy and small vessel ischemic changes. Normal alignment of the cervical spine. Diffuse degenerative changes. No acute bony abnormalities. Electronically Signed   By: Burman Nieves M.D.   On: 10/14/2015 02:06   Ct Head W Wo Contrast  10/18/2015  CLINICAL DATA:  Recent orthopedic surgery.  Confusion. EXAM: CT HEAD WITHOUT AND WITH CONTRAST TECHNIQUE: Contiguous axial images were obtained from the base of the skull through the vertex without and with intravenous contrast CONTRAST:  50mL OMNIPAQUE IOHEXOL 300 MG/ML  SOLN COMPARISON:  10/14/2015 FINDINGS: There is newly seen low density in the left cerebellum that could be a recent infarction. Cerebral hemispheres show atrophy and chronic small vessel disease but no sign of supratentorial acute infarction. No mass lesion, hemorrhage, hydrocephalus or extra-axial collection. No abnormal contrast enhancement. IMPRESSION: Brain atrophy. Newly seen low  density in the left cerebellum that could represent an acute infarction. Electronically Signed   By: Paulina Fusi M.D.   On: 10/18/2015 14:30   Ct Angio Chest Pe W/cm &/or Wo Cm  10/19/2015  CLINICAL DATA:  Shortness of breath. Possible pulmonary embolus. Recent left femur fracture. EXAM: CT ANGIOGRAPHY CHEST WITH CONTRAST TECHNIQUE: Multidetector CT imaging of the chest was performed using the standard protocol during bolus administration of intravenous contrast. Multiplanar CT image reconstructions and MIPs were obtained to evaluate the vascular anatomy. CONTRAST:  70mL OMNIPAQUE IOHEXOL 350 MG/ML SOLN COMPARISON:  None. FINDINGS: Technically adequate study with good opacification of the central and segmental pulmonary arteries. Filling defects are demonstrated in both distal main pulmonary arteries, extending into bilateral upper and lower lobe pulmonary arteries. Examination is positive for acute pulmonary embolus. RV to LV ratio measures 2.83, suggesting possible right heart strain. The left atrium and right atrium are also dilated. Coronary artery calcifications. Esophagus is decompressed. No significant lymphadenopathy in the chest. Normal caliber thoracic aorta with scattered calcification. Evaluation of lungs is limited due to respiratory motion artifact. There is mild dependent change in the lung bases with small bilateral pleural effusions. Ingestion of focal ground-glass nodules in the left mid lung, measuring up to 7 mm diameter. These are likely to be inflammatory but consider follow-up in 3 months for further evaluation. Hazy mosaic pattern to the upper lungs may indicate edema. No pneumothorax. Included portions of the upper abdominal organs are grossly unremarkable. Degenerative changes throughout the spine. There is anterior compression of a mid thoracic vertebra. Age is indeterminate. Normal alignment of the thoracic spine with accentuated kyphosis at the level of the compressed vertebra.  Review of the MIP images confirms the above findings. IMPRESSION: Examination is positive for bilateral central and segmental pulmonary emboli. Elevated RV to LV ratio suggest evidence of possible right heart strain. Right atrium and left atrial also enlarged. Small bilateral pleural effusions with possible pulmonary edema. Ground-glass nodule  in the left mid lung. Consider follow-up in 3 months for further evaluation. These results were called by telephone at the time of interpretation on 10/19/2015 at 1:30 am to the patient's nurse on [facility], Ulla Potash, who verbally acknowledged these results. Electronically Signed   By: Burman Nieves M.D.   On: 10/19/2015 01:32   Ct Cervical Spine Wo Contrast  10/14/2015  CLINICAL DATA:  Witnessed fall at home. Did not hit head. Left lower femoral fracture. EXAM: CT HEAD WITHOUT CONTRAST CT CERVICAL SPINE WITHOUT CONTRAST TECHNIQUE: Multidetector CT imaging of the head and cervical spine was performed following the standard protocol without intravenous contrast. Multiplanar CT image reconstructions of the cervical spine were also generated. COMPARISON:  None. FINDINGS: CT HEAD FINDINGS Diffuse cerebral atrophy. Ventricular dilatation consistent with central atrophy. Low-attenuation changes in the deep white matter consistent with small vessel ischemia. No mass effect or midline shift. No abnormal extra-axial fluid collections. Gray-white matter junctions are distinct. Basal cisterns are not effaced. No evidence of acute intracranial hemorrhage. No depressed skull fractures. Partial opacification of some of the mastoid air cells bilaterally. Visualized paranasal sinuses are not opacified. Vascular calcifications. CT CERVICAL SPINE FINDINGS Diffuse bone demineralization. Normal alignment of the cervical spine. Degenerative changes throughout with narrowed cervical interspaces and associated endplate hypertrophic changes. Degenerative changes in the facet joints. No vertebral  compression deformities. No prevertebral soft tissue swelling. Benign-appearing cystic lesion in the right lateral mass of C5. C1-2 articulation appears intact. Interstitial infiltrates in the lung apices may indicate edema. Soft tissues are unremarkable. Vascular calcifications. IMPRESSION: No acute intracranial abnormalities. Chronic atrophy and small vessel ischemic changes. Normal alignment of the cervical spine. Diffuse degenerative changes. No acute bony abnormalities. Electronically Signed   By: Burman Nieves M.D.   On: 10/14/2015 02:06   Mr Brain Wo Contrast  10/18/2015  CLINICAL DATA:  Acute onset of mental status changes. Abnormal head CT suggesting cerebellar infarction. EXAM: MRI HEAD WITHOUT CONTRAST TECHNIQUE: Multiplanar, multiecho pulse sequences of the brain and surrounding structures were obtained without intravenous contrast. COMPARISON:  Head CT same day FINDINGS: Diffusion imaging shows acute infarction in the left cerebellum at the site of the CT hypodensity. In addition, there is infarction throughout the left posterior cerebral artery territory affecting the posterior medial temporal lobe and occipital lobe as well as focal involvement of lateral thalamus. There is also some acute infarction in the right occipital cortex. Acute infarction is present in the left parietal cortical and subcortical brain and in the posterior left frontal deep white matter. There is no detectable hemorrhage. No mass effect or shift. No hydrocephalus or extra-axial collection. IMPRESSION: Acute infarction in the left cerebellum as suggested by CT. Acute infarction throughout the left posterior cerebral artery territory affecting the posterior medial temporal lobe and occipital lobe with focal involvement of the lateral thalamus. Additional acute infarction in the left parietal cortical and subcortical brain. Additional sub cm infarction in the left posterior frontal deep white matter. Electronically Signed    By: Paulina Fusi M.D.   On: 10/18/2015 19:43   Dg Pelvis Portable  10/14/2015  CLINICAL DATA:  Status post fall, with concern for pelvic injury. Initial encounter. EXAM: PORTABLE PELVIS 1-2 VIEWS COMPARISON:  None. FINDINGS: There is no evidence of fracture or dislocation. Both femoral heads are seated normally within their respective acetabula. The patient is status post vertebroplasty at the lower lumbar spine. Mild surrounding degenerative change is noted. The sacroiliac joints are unremarkable in appearance. The visualized bowel gas  pattern is grossly unremarkable in appearance. Scattered vascular calcifications are seen. IMPRESSION: No evidence of fracture or dislocation. Electronically Signed   By: Roanna Raider M.D.   On: 10/14/2015 01:18   Ct Femur Left Wo Contrast  10/14/2015  CLINICAL DATA:  Witnessed fall at home. Left lower femoral fracture on plain radiographs. EXAM: CT OF THE LEFT FEMUR WITHOUT CONTRAST TECHNIQUE: Multidetector CT imaging was performed according to the standard protocol. Multiplanar CT image reconstructions were also generated. COMPARISON:  Left knee 10/14/2015 FINDINGS: Left total knee arthroplasty. Acute comminuted fractures of the distal left femoral metaphysis. Fracture lines extend to the superior portion of the femoral prosthesis. There is anterior displacement and angulation of the distal fracture fragments with multiple displaced and angulated butterfly fragments. We bone femoral head fragment is rotated anteriorly. Multiple displaced cortical fragments are present with overriding and impaction of fracture fragments. Scattered gas collections are demonstrated in about the fracture fragments and infiltrating into the subcutaneous fat into the adjacent muscle compartments anteriorly and posteriorly. Gas within the fracture is consistent with open fracture. A displaced bone fragment is demonstrated along the superolateral aspect of the left lower leg at the level of the  knee with extension to the skin surface consistent with a penetrating fragment. Small left knee effusion. Soft tissue infiltration in the subcutaneous fat and muscles around the left kidney consistent with edema or hematomas. Prominent vascular calcifications. No dislocation of the left hip. Visualization of the left knee is limited due to streak artifact arising from the femoral prosthesis. IMPRESSION: Acute comminuted fractures of the distal left femoral metaphysis extending to the base of the femoral component of the knee arthroplasty. Diffuse soft tissue gas is present with a displaced bone fragment demonstrated just beneath the skin surface consistent with an open fracture. Electronically Signed   By: Burman Nieves M.D.   On: 10/14/2015 02:12   Dg Chest Port 1 View  10/19/2015  CLINICAL DATA:  Hypoxia EXAM: PORTABLE CHEST 1 VIEW COMPARISON:  10/17/2015 FINDINGS: Cardiomegaly. No overt edema. Left basilar airspace opacity could reflect atelectasis or infiltrate, slightly improved since prior study. No visible effusions. No acute bony abnormality. IMPRESSION: Cardiomegaly. Left base atelectasis or infiltrate slightly improved. Electronically Signed   By: Charlett Nose M.D.   On: 10/19/2015 08:53   Dg Chest Port 1 View  10/17/2015  CLINICAL DATA:  Hypoxia and recent femoral fracture EXAM: PORTABLE CHEST 1 VIEW COMPARISON:  10/15/2015 FINDINGS: Cardiac shadow is mildly enlarged but stable. The lungs are well aerated bilaterally. Mild atelectatic changes are noted in the left base with a small left pleural effusion. No acute bony abnormality is seen. IMPRESSION: Left basilar atelectasis and small left pleural effusion. Electronically Signed   By: Alcide Clever M.D.   On: 10/17/2015 12:03   Dg Chest Port 1 View  10/15/2015  CLINICAL DATA:  Chest pain and hypoxemia. Atrial fibrillation. Patient fell from standing position yesterday. EXAM: PORTABLE CHEST 1 VIEW COMPARISON:  10/14/2015 FINDINGS: Mild  cardiomegaly and ectasia of the thoracic aorta are stable. No pneumothorax or pleural effusion visualized. Mild linear opacity in left lung base may be due to atelectasis or scarring. No evidence of pulmonary consolidation or edema. Old left medial clavicle fracture deformity noted. IMPRESSION: Mild cardiomegaly and left basilar atelectasis versus scarring. No evidence of pneumothorax or hemothorax. Electronically Signed   By: Myles Rosenthal M.D.   On: 10/15/2015 12:03   Dg Chest Portable 1 View  10/14/2015  CLINICAL DATA:  Status post  fall, with concern for chest injury. Initial encounter. EXAM: PORTABLE CHEST 1 VIEW COMPARISON:  None. FINDINGS: The lungs are well-aerated. Mild peribronchial thickening is noted. There is no evidence of focal opacification, pleural effusion or pneumothorax. The cardiomediastinal silhouette is mildly enlarged. No acute osseous abnormalities are seen. IMPRESSION: Mild peribronchial thickening noted. Mild cardiomegaly. Lungs otherwise grossly clear. No displaced rib fracture seen. Electronically Signed   By: Roanna Raider M.D.   On: 10/14/2015 01:17   Dg Knee Left Port  10/14/2015  CLINICAL DATA:  Trauma.  Fall.  Open left knee injury. EXAM: PORTABLE LEFT KNEE - 1-2 VIEW COMPARISON:  None. FINDINGS: Left total knee arthroplasty with patellar femoral component. There is an acute comminuted and displaced fracture of the distal left femoral metaphysis with fracture lines extending to the base of the femoral component of the arthroplasty. There is anterior angulation of the major distal fracture fragments with anterior and medial displacement of fracture fragments. Medial overriding of fracture fragments. Several displaced butterfly fragments are present. Calcifications in the suprapatellar space appear to be well corticated and may be old loose bodies. Small effusion. Proximal tibia and fibula appear intact. IMPRESSION: Acute comminuted and displaced fractures of the distal left  femoral metaphysis extending to the base of the to femoral component of the left knee arthroplasty. Electronically Signed   By: Burman Nieves M.D.   On: 10/14/2015 01:19   Dg C-arm Gt 120 Min  10/16/2015  CLINICAL DATA:  Status post ORIF for distal left femoral fracture. 3 minutes, 44 seconds fluoro time reported EXAM: DG C-ARM GT 120 MIN none CONTRAST:  None FLUOROSCOPY TIME:  Fluoroscopy Time (in minutes and seconds): 3 minutes, 44 second Number of Acquired Images:  6 COMPARISON:  None. FINDINGS: Six fluoro spot images are reviewed. There is intramedullary nailing of a distal femoral metadiaphyseal fracture. Alignment of the fracture is now near anatomic. The prosthetic knee joint appears appropriate position. IMPRESSION: Intraoperative fluoro spot images revealing ORIF of the distal left femoral meta diaphyseal fracture without evidence of immediate postprocedure complication. Electronically Signed   By: David  [place]and M.D.   On: 10/16/2015 10:27   Dg Femur Port Min 2 Views Left  10/16/2015  CLINICAL DATA:  Intra medullary nailing for distal femur fracture EXAM: LEFT FEMUR PORTABLE 2 VIEWS COMPARISON:  Intraoperative images obtained earlier in the day FINDINGS: Frontal and lateral views were obtained. There is rod fixation through a comminuted fracture of the distal femoral diaphysis. There is displacement of several smaller fracture fragments with overall alignment of the major fracture fragments near anatomic. There are screws just superior to a total knee replacement on the left. The tibial and femoral prosthetic components appear intact. There is no evidence of dislocation. There is a small knee joint effusion. There are foci of arterial vascular calcification. IMPRESSION: Screw and rod fixation through a comminuted fracture of the distal femoral diaphysis with major fracture fragments in near anatomic alignment. Several smaller fragments are displaced medially and laterally. Total knee replacement  present with the femoral and tibial prosthetic components appearing intact. No new fracture. No dislocation. Small knee joint effusion. Electronically Signed   By: Bretta Bang III M.D.   On: 10/16/2015 13:22   Dg Femur Port Min 2 Views Left  10/16/2015  CLINICAL DATA:  Distal LEFT femur fracture post ORIF EXAM: LEFT FEMUR PORTABLE 2 VIEWS COMPARISON:  Portable exam 0959 hours compared to 10/14/2015 FINDINGS: Osseous demineralization. IM nail with proximal and distal locking screws identified across  day comminuted distal LEFT femoral metadiaphyseal fracture. Fracture fragments are minimally displaced. IM nail extends to near the previously identified LEFT knee prosthesis. No new focal bony abnormalities identified. IMPRESSION: Post nailing of a minimally displaced distal LEFT femoral meta diaphyseal fracture. Osseous demineralization and LEFT knee prosthesis again seen. Electronically Signed   By: Ulyses Southward M.D.   On: 10/16/2015 10:27   Dg Femur Port Min 2 Views Left  10/14/2015  CLINICAL DATA:  Trauma.  [employer].  Left open knee injury. EXAM: LEFT FEMUR PORTABLE 2 VIEWS COMPARISON:  None. FINDINGS: Acute comminuted and displaced fractures of the distal left radial metaphysis extending to the base of the femoral component of a left knee arthroplasty. See additional report of left knee same date. Limited view of the proximal left femur appears intact. No gross evidence of dislocation of the left hip. Vascular calcifications. IMPRESSION: Acute comminuted and displaced fractures of the distal left radial metaphysis. See additional report of left knee. Proximal left femur is grossly intact. Electronically Signed   By: Burman Nieves M.D.   On: 10/14/2015 01:23    CBC  Recent Labs Lab 10/18/15 0449 10/19/15 1030 10/20/15 0315 10/21/15 0415 10/22/15 0625  WBC 10.2 9.5 10.2 9.4 13.3*  HGB 8.6* 8.6* 8.5* 8.9* 10.0*  HCT 27.3* 27.6* 28.3* 29.1* 32.3*  PLT 210 269 306 363 345  MCV 80.1 80.7  80.9 80.2 81.6  MCH 25.2* 25.1* 24.3* 24.5* 25.3*  MCHC 31.5 31.2 30.0 30.6 31.0  RDW 18.0* 19.2* 19.6* 19.8* 20.5*    Chemistries   Recent Labs Lab 10/17/15 0324 10/18/15 0449 10/19/15 1030 10/21/15 0415 10/22/15 0625  NA 135 137 139 137 137  K 3.9 3.9 3.5 2.8* 3.8  CL 102 103 107 102 103  CO2 20*  GLUCOSE 235* 245* 200* 201* 170*  BUN CREATININE 0.78 0.77 0.72 0.74 0.69  CALCIUM 8.1* 8.2* 7.8* 8.0* 8.3*  MG  --   --   --   --  1.8   ------------------------------------------------------------------------------------------------------------------ estimated creatinine clearance is 43.2 mL/min (by C-G formula based on Cr of 0.69). ------------------------------------------------------------------------------------------------------------------ No results for input(s): HGBA1C in the last 72 hours. ------------------------------------------------------------------------------------------------------------------ No results for input(s): CHOL, HDL, LDLCALC, TRIG, CHOLHDL, LDLDIRECT in the last 72 hours. ------------------------------------------------------------------------------------------------------------------ No results for input(s): TSH, T4TOTAL, T3FREE, THYROIDAB in the last 72 hours.  Invalid input(s): FREET3 ------------------------------------------------------------------------------------------------------------------ No results for input(s): VITAMINB12, FOLATE, FERRITIN, TIBC, IRON, RETICCTPCT in the last 72 hours.  Coagulation profile No results for input(s): INR, PROTIME in the last 168 hours.  No results for input(s): DDIMER in the last 72 hours.  Cardiac Enzymes No results for input(s): CKMB, TROPONINI, MYOGLOBIN in the last 168 hours.  Invalid input(s): CK ------------------------------------------------------------------------------------------------------------------ Invalid input(s): POCBNP   Recent Labs  10/21/15 0706  10/21/15 1128 10/21/15 1645 10/21/15 2134 10/22/15 0821 10/22/15 1147  GLUCAP 219* 218* 192* 221* 165* 244*     Lonzie Simmer M.D. [facility] 10/22/2015, 1:25 PM  Pager: [phone] Between 7am to 7pm - call Pager - [phone]  After 7pm go to [email or web address] - password [email or web address]  Call night coverage person covering after 7pm

## 2015-10-22 NOTE — Clinical Social Work Note (Signed)
CSW initially informed that patient ready for discharge and [facility] contacted and informed.  CSW later advised that discharge cancelled for today. Steward Drone with [facility] contacted and updated.  CSW will continue to follow and assist with d/c to a skilled nursing facility once medically stable.  Genelle Bal, MSW, LCSW Licensed Clinical Social Worker Clinical Social Work Department [employer] [phone]

## 2015-10-22 NOTE — Progress Notes (Signed)
PT Cancellation Note  Patient Details Name: [name] [name] MRN: [id number] DOB: 1922-06-02   Cancelled Treatment:    Reason Eval/Treat Not Completed: Other (comment) (Palliative cancelled order 10/21/15).  PT signing off.  Michail Jewels PT, DPT [phone] Pager: [phone] 10/22/2015, 10:48 AM

## 2015-10-22 NOTE — Progress Notes (Signed)
OT Cancellation Note  Patient Details Name: [name] [name] MRN: [id number] DOB: 1922/04/20   Cancelled Treatment:    Reason Eval/Treat Not Completed: Other (comment) (Palliative cancelled order 10/21/15)  Felecia Shelling   OTR/L Pager: [phone] Office: [phone] .  10/22/2015, 10:33 AM

## 2015-10-23 DIAGNOSIS — Z515 Encounter for palliative care: Secondary | ICD-10-CM | POA: Insufficient documentation

## 2015-10-23 LAB — CBC
HEMATOCRIT: 32.4 % — AB (ref 36.0–46.0)
HEMOGLOBIN: 9.9 g/dL — AB (ref 12.0–15.0)
MCH: 24.9 pg — ABNORMAL LOW (ref 26.0–34.0)
MCHC: 30.6 g/dL (ref 30.0–36.0)
MCV: 81.4 fL (ref 78.0–100.0)
Platelets: 370 10*3/uL (ref 150–400)
RBC: 3.98 MIL/uL (ref 3.87–5.11)
RDW: 20.7 % — ABNORMAL HIGH (ref 11.5–15.5)
WBC: 13.1 10*3/uL — AB (ref 4.0–10.5)

## 2015-10-23 LAB — HEPARIN LEVEL (UNFRACTIONATED): HEPARIN UNFRACTIONATED: 0.48 [IU]/mL (ref 0.30–0.70)

## 2015-10-23 LAB — FACTOR 5 LEIDEN

## 2015-10-23 LAB — GLUCOSE, CAPILLARY: Glucose-Capillary: 150 mg/dL — ABNORMAL HIGH (ref 65–99)

## 2015-10-23 MED ORDER — APIXABAN 5 MG PO TABS: 5.0000 mg | ORAL_TABLET | Freq: Two times a day (BID) | ORAL | Status: DC

## 2015-10-23 MED ORDER — APIXABAN 5 MG PO TABS
10.0000 mg | ORAL_TABLET | Freq: Two times a day (BID) | ORAL | Status: DC
  Administered 2015-10-23: 10 mg via ORAL
  Filled 2015-10-23: qty 2

## 2015-10-23 MED ORDER — APIXABAN 5 MG PO TABS
5.0000 mg | ORAL_TABLET | Freq: Two times a day (BID) | ORAL | Status: DC
Start: 2015-10-27 — End: 2015-10-23

## 2015-10-23 MED ORDER — APIXABAN 5 MG PO TABS: 10.0000 mg | ORAL_TABLET | Freq: Two times a day (BID) | ORAL | Status: DC

## 2015-10-23 MED ORDER — APIXABAN 5 MG PO TABS: 10.0000 mg | ORAL_TABLET | Freq: Two times a day (BID) | ORAL | Status: AC

## 2015-10-23 MED ORDER — IPRATROPIUM-ALBUTEROL 0.5-2.5 (3) MG/3ML IN SOLN: 3.0000 mL | Freq: Four times a day (QID) | RESPIRATORY_TRACT | Status: AC | PRN

## 2015-10-23 MED ORDER — APIXABAN 5 MG PO TABS: 5.0000 mg | ORAL_TABLET | Freq: Two times a day (BID) | ORAL | Status: AC

## 2015-10-23 MED ORDER — SENNOSIDES-DOCUSATE SODIUM 8.6-50 MG PO TABS: 2.0000 | ORAL_TABLET | Freq: Every day | ORAL | Status: AC

## 2015-10-23 MED ORDER — OXYCODONE HCL 5 MG/5ML PO SOLN: 2.5000 mg | ORAL | Status: AC | PRN

## 2015-10-23 MED ORDER — OLANZAPINE 2.5 MG PO TABS: 2.5000 mg | ORAL_TABLET | Freq: Every evening | ORAL | Status: AC | PRN

## 2015-10-23 MED ORDER — RIVAROXABAN 20 MG PO TABS
20.0000 mg | ORAL_TABLET | Freq: Every day | ORAL | Status: DC
  Administered 2015-10-23: 20 mg via ORAL
  Filled 2015-10-23 (×2): qty 1

## 2015-10-23 NOTE — Progress Notes (Signed)
ANTICOAGULATION CONSULT NOTE - Initial Consult  Pharmacy Consult for Heparin>>Xarelto Indication: pulmonary embolus, stroke  Patient Measurements: Height:  (160 cm) Weight: 154 lb 8.7 oz (70.1 kg) IBW/kg (Calculated) : 52.4  Vital Signs: Temp: 97.7 F (36.5 C) (02/02 0343) Temp Source: Oral (02/02 0343) BP: 143/73 mmHg (02/02 0343) Pulse Rate: 78 (02/02 0343)  Labs:  Recent Labs  10/21/15 0415 10/22/15 0617 10/22/15 0625 10/23/15 0426  HGB 8.9*  --  10.0* 9.9*  HCT 29.1*  --  32.3* 32.4*  PLT 363  --  345 370  APTT 80*  --   --   --   HEPARINUNFRC 0.44 0.34  --  0.48  CREATININE 0.74  --  0.69  --     Estimated Creatinine Clearance: 43 mL/min (by C-G formula based on Cr of 0.69).   Medical History: Past Medical History  Diagnosis Date  . Hypertension   . Diabetes mellitus without complication (HCC)   . COPD (chronic obstructive pulmonary disease) (HCC)   . Atrial fibrillation (HCC)   . Glaucoma     Bilaterally  . CHF (congestive heart failure) (HCC)   . HLD (hyperlipidemia)   . GERD (gastroesophageal reflux disease)   . Depression    Assessment: 82 y/o F on heparin briefly while Xarelto was on hold, transitioning back to Xarelto today, Hgb low but stable, renal function age appropriate  Goal of Therapy:  Monitor platelets by anticoagulation protocol: Yes   Plan:  -DC heparin  -Re-start Xarelto 20 mg daily with breakfast (per home dosing) -Minimum q72h CBC while inpatient -Monitor for bleeding  Abran Duke 10/23/2015,7:28 AM

## 2015-10-23 NOTE — Care Management Note (Signed)
Case Management Note  Patient Details  Name: DENNI FRANCE MRN: [id number] Date of Birth: 06-18-1922  Subjective/Objective:   Patient for dc to SNF today , CSW following.                 Action/Plan:   Expected Discharge Date:                  Expected Discharge Plan:  Skilled Nursing Facility  In-House Referral:  Clinical Social Work  Discharge planning Services  CM Consult  Post Acute Care Choice:    Choice offered to:     DME Arranged:    DME Agency:     HH Arranged:    HH Agency:     Status of Service:  Completed, signed off  Medicare Important Message Given:  Yes Date Medicare IM Given:    Medicare IM give by:    Date Additional Medicare IM Given:    Additional Medicare Important Message give by:     If discussed at Long Length of Stay Meetings, dates discussed:    Additional Comments:  Leone Haven, RN 10/23/2015, 11:45 AM

## 2015-10-23 NOTE — Clinical Social Work Note (Signed)
Patient discharged to [facility] in [place] today via ambulance transport. Daughter aware of discharge and ambulance transport.  Also talked with daughter regarding her concerns about patient getting rehab at skilled facility.  Genelle Bal, MSW, LCSW Licensed Clinical Social Worker Clinical Social Work Department [employer] [phone]

## 2015-10-23 NOTE — Progress Notes (Signed)
Daily Progress Note   Patient Name: [name] [name]       Date: 10/23/2015 DOB: 1921-09-30  Age: 82 y.o. MRN#: [id number] Attending Physician: Cathren Harsh, MD Primary Care Physician: [name], MD Admit Date: 10/14/2015  Reason for Consultation/Follow-up: Disposition  Subjective: Ms. Chai looks much improved today. She is alert and OOB. Po intake improving. Interval Events: PMT consult 1/31 Length of Stay: 9 days  Current Medications: Scheduled Meds:  . antiseptic oral rinse  7 mL Mouth Rinse BID  . dorzolamide  1 drop Both Eyes BID   And  . timolol  1 drop Both Eyes BID  . feeding supplement (ENSURE ENLIVE)  237 mL Oral BID BM  . hydrALAZINE  5 mg Intravenous Once  . linagliptin  5 mg Oral Daily  . pantoprazole  40 mg Oral Daily  . rivaroxaban  20 mg Oral Q breakfast  . senna-docusate  2 tablet Oral QHS  . verapamil  40 mg Oral BID    Continuous Infusions: . sodium chloride 50 mL/hr at 10/23/15 0931    PRN Meds: acetaminophen **OR** acetaminophen, ipratropium-albuterol, menthol-cetylpyridinium **OR** phenol, metoCLOPramide **OR** metoCLOPramide (REGLAN) injection, OLANZapine, ondansetron **OR** ondansetron (ZOFRAN) IV, oxyCODONE, polyvinyl alcohol  Physical Exam: Physical Exam              Vital Signs: BP 133/74 mmHg  Pulse 144  Temp(Src) 97.9 F (36.6 C) (Oral)  Resp 23  Ht  (1.6 m)  Wt 70.1 kg (154 lb 8.7 oz)  BMI 27.38 kg/m2  SpO2 97% SpO2: SpO2: 97 % O2 Device: O2 Device: Nasal Cannula O2 Flow Rate: O2 Flow Rate (L/min): 3 L/min (decreased to 1l/min)  Intake/output summary:  Intake/Output Summary (Last 24 hours) at 10/23/15 1019 Last data filed at 10/23/15 0900  Gross per 24 hour  Intake 1616.55 ml  Output   1350 ml  Net 266.55 ml   LBM: Last  BM Date: 10/21/15 Baseline Weight: Weight: 67.132 kg (148 lb) Most recent weight: Weight: 70.1 kg (154 lb 8.7 oz)       Palliative Assessment/Data: Flowsheet Rows        Most Recent Value   Intake Tab    Referral Department  Hospitalist   Unit at Time of Referral  Intermediate Care Unit   Palliative Care Primary Diagnosis  Cardiac   Date Notified  10/21/15   Palliative Care Type  New Palliative care   Reason for referral  Clarify Goals of Care   Date of Admission  10/14/15   Date first seen by Palliative Care  10/21/15   # of days IP prior to Palliative referral  7   Clinical Assessment    Palliative Performance Scale Score  20%   Pain Max last 24 hours  Not able to report   Pain Min Last 24 hours  Not able to report   Dyspnea Max Last 24 Hours  Not able to report   Dyspnea Min Last 24 hours  Not able to report   Nausea Max Last 24 Hours  Not able to report   Nausea Min Last 24 Hours  Not able to report   Anxiety Max Last 24 Hours  Not able to report   Anxiety Min Last 24 Hours  Not able to report   Other Max Last 24 Hours  Not able to report   Psychosocial & Spiritual Assessment    Palliative Care Outcomes    Patient/Family meeting held?  Yes   Who was at the meeting?  Primitivo Gauze   Palliative Care Outcomes  Improved pain interventions, Clarified goals of care, Provided psychosocial or spiritual support, Counseled regarding hospice, Provided advance care planning   Palliative Care follow-up planned  Yes, Facility   Other Treatment Preference Instructions  Follow-up at [facility]      Additional Data Reviewed: CBC    Component Value Date/Time   WBC 13.1* 10/23/2015 0426   RBC 3.98 10/23/2015 0426   HGB 9.9* 10/23/2015 0426   HCT 32.4* 10/23/2015 0426   PLT 370 10/23/2015 0426   MCV 81.4 10/23/2015 0426   MCH 24.9* 10/23/2015 0426   MCHC 30.6 10/23/2015 0426   RDW 20.7* 10/23/2015 0426    CMP     Component Value Date/Time   NA 137 10/22/2015 0625   K 3.8  10/22/2015 0625   CL 103 10/22/2015 0625   CO2 20* 10/22/2015 0625   GLUCOSE 170* 10/22/2015 0625   BUN 8 10/22/2015 0625   CREATININE 0.69 10/22/2015 0625   CALCIUM 8.3* 10/22/2015 0625   PROT 5.7* 10/14/2015 0535   ALBUMIN 3.0* 10/14/2015 0535   AST 18 10/14/2015 0535   ALT 12* 10/14/2015 0535   ALKPHOS 54 10/14/2015 0535   BILITOT 0.7 10/14/2015 0535   GFRNONAA >60 10/22/2015 0625   GFRAA >60 10/22/2015 0625       Problem List:  Patient Active Problem List   Diagnosis Date Noted  . Palliative care by specialist   . Paroxysmal atrial fibrillation (HCC)   . Cerebral thrombosis with cerebral infarction 10/19/2015  . CVA (cerebral infarction)   . Pulmonary emboli (HCC)   . Open fracture of left femur, type I or II, initial encounter 10/16/2015  . Open femur fracture, left (HCC) 10/14/2015  . Femur fracture, left (HCC) 10/14/2015  . Diabetes mellitus without complication (HCC)   . COPD (chronic obstructive pulmonary disease) (HCC)   . Atrial fibrillation (HCC)   . CHF (congestive heart failure) (HCC)   . HLD (hyperlipidemia)   . GERD (gastroesophageal reflux disease)   . Fall   . Chronic obstructive pulmonary disease [facility])      Palliative Care Assessment & Plan    1.Code Status:  DNR    Code Status Orders        Start     Ordered  10/21/15 1718  Do not attempt resuscitation (DNR)   Continuous    Question Answer Comment  In the event of cardiac or respiratory ARREST Do not call a "code blue"   In the event of cardiac or respiratory ARREST Do not perform Intubation, CPR, defibrillation or ACLS   In the event of cardiac or respiratory ARREST Use medication by any route, position, wound care, and other measures to relive pain and suffering. May use oxygen, suction and manual treatment of airway obstruction as needed for comfort.      10/21/15 1719    Code Status History    Date Active Date Inactive Code Status Order ID Comments User Context   10/21/2015   5:15 PM 10/21/2015  5:19 PM DNR [id number]  Edsel Petrin, DO Inpatient   10/19/2015  3:28 AM 10/21/2015  5:14 PM Partial Code [id number]  Ramond Marrow, DO Inpatient   10/16/2015  1:24 PM 10/19/2015  3:28 AM DNR [id number]  Samson Frederic, MD Inpatient   10/14/2015  5:23 AM 10/16/2015  1:24 PM DNR [id number]  Venita Lick, MD Inpatient   10/14/2015  3:41 AM 10/14/2015  5:23 AM DNR [id number]  Lorretta Harp, MD ED    Advance Directive Documentation        Most Recent Value   Type of Advance Directive  Healthcare Power of Attorney, Living will   Pre-existing out of facility DNR order (yellow form or pink MOST form)     "MOST" Form in Place?         2. Goals of Care/Additional Recommendations:  Rehab to fullest potential and if decline occurs transition to hospice care  Limitations on Scope of Treatment: Avoid Hospitalization, Minimize Medications and Full Scope Treatment  Desire for further Chaplaincy support:yes  Psycho-social Needs: Caregiving  Support/Resources, Education on Hospice and Referral to [facility]   3. Symptom Management: 1.Continue scheduled Tylenol amd PRN oxycodone for pain-pre-medicate prior to PT or movement. 2. Discontinue Foley 3. Discontinue Oxygen  4. Palliative Prophylaxis:   Aspiration, Bowel Regimen, Delirium Protocol, Eye Care, Frequent Pain Assessment and Oral Care  5. Prognosis: Unable to determine  6. Discharge Planning:  Skilled Nursing Facility for rehab with Palliative care service follow-up   Care plan was discussed with patient, daughter and RN.  Thank you for allowing the Palliative Medicine Team to assist in the care of this patient.   Time In: 10:15 Time Out: 10:30 Total Time 15 minutes Prolonged Time Billed no        Edsel Petrin, DO  10/23/2015, 10:19 AM  Please contact Palliative Medicine Team phone at [phone] for questions and concerns.

## 2015-10-23 NOTE — Progress Notes (Signed)
   Subjective:  Patient reports pain as mild to moderate.  No c/o. Sitting up in a chair.  Objective:   VITALS:   Filed Vitals:   10/22/15 2000 10/22/15 2300 10/23/15 0343 10/23/15 0741  BP: 136/78 126/87 143/73 133/74  Pulse: 82 98 78 144  Temp: 98.6 F (37 C) 98 F (36.7 C) 97.7 F (36.5 C) 97.9 F (36.6 C)  TempSrc: Axillary Oral Oral Oral  Resp: Height:    (1.6 m)   Weight:   70.1 kg (154 lb 8.7 oz)   SpO2: 100% 94% 95% 97%   She is more awake and alert today ABD soft Sensation intact distally Intact pulses distally Dorsiflexion/Plantar flexion intact Incision: dressing C/D/I Compartment soft Knee immobilizer in place incis c/d/i  Lab Results  Component Value Date   WBC 13.1* 10/23/2015   HGB 9.9* 10/23/2015   HCT 32.4* 10/23/2015   MCV 81.4 10/23/2015   PLT 370 10/23/2015   BMET    Component Value Date/Time   NA 137 10/22/2015 0625   K 3.8 10/22/2015 0625   CL 103 10/22/2015 0625   CO2 20* 10/22/2015 0625   GLUCOSE 170* 10/22/2015 0625   BUN 8 10/22/2015 0625   CREATININE 0.69 10/22/2015 0625   CALCIUM 8.3* 10/22/2015 0625   GFRNONAA >60 10/22/2015 0625   GFRAA >60 10/22/2015 0625     Assessment/Plan: 7 Days Post-Op   Principal Problem:   Open femur fracture, left (HCC) Active Problems:   Diabetes mellitus without complication (HCC)   COPD (chronic obstructive pulmonary disease) (HCC)   Atrial fibrillation (HCC)   CHF (congestive heart failure) (HCC)   HLD (hyperlipidemia)   GERD (gastroesophageal reflux disease)   Femur fracture, left (HCC)   Fall   Chronic obstructive pulmonary disease (HCC)   Open fracture of left femur, type I or II, initial encounter   Cerebral thrombosis with cerebral infarction   CVA (cerebral infarction)   Pulmonary emboli (HCC)   Paroxysmal atrial fibrillation (HCC)   Palliative care by specialist   Knee immobilizer at all times NWB LLE DVT/PE: per primary team PT/OT Avoid  narcotics Dispo: d/c planning   Raedyn Klinck, [name] 10/23/2015, 9:31 AM   Samson Frederic, MD Cell [phone]

## 2015-10-23 NOTE — Progress Notes (Signed)
Speech Language Pathology Treatment: Dysphagia  Patient Details Name: KALEYAH LABRECK MRN: [id number] DOB: 03-11-1922 Today's Date: 10/23/2015 Time: 4098-1191 SLP Time Calculation (min) (ACUTE ONLY): 13 min  Assessment / Plan / Recommendation Clinical Impression  SLP returned for f/u dysphagia treatment focusing mainly on family education. Pt is more alert today and sitting upright in her chair. Plan per Dr. Phillips Odor is for d/c to SNF today. Discussed with pt/family - plan to d/c on current textures with SLP f/u at SNF to facilitate solid advancement as alertness improves. Pt asked for water and was observed to take several sips with Mod A from SLP for pacing with no overt signs of aspiration. Reviewed safe swallowing precautions.   HPI HPI: 82 y.o. female hx of A fib, DM, HTN admitted 1/24 after a fall in which she suffered a left leg distal femur fracture, s/p ORIF left femur on 1/26. AMS noted after procedure, prompting an MRI that showed acute infarct in the left cerebellum, left PCA territory involving posterior medial temporal lobe and occipital lobe and multiple infarcts in left parietal lobe. Daughter says that pt coughs frequently on food and liquids PTA, and that this has been a more chronic problem. She also says pt has a h/o reflux.      SLP Plan  Continue with current plan of care     Recommendations  Diet recommendations: Dysphagia 1 (puree);Thin liquid Liquids provided via: Cup Medication Administration: Crushed with puree Supervision: Full supervision/cueing for compensatory strategies Compensations: Minimize environmental distractions;Slow rate;Small sips/bites Postural Changes and/or Swallow Maneuvers: Seated upright 90 degrees;Out of bed for meals             Oral Care Recommendations: Oral care QID Follow up Recommendations: Skilled Nursing facility Plan: Continue with current plan of care     GO               Maxcine Ham, M.A.  CCC-SLP [phone]  Maxcine Ham 10/23/2015, 11:18 AM

## 2015-10-23 NOTE — Discharge Instructions (Signed)
Knee immobilizer at all times Non weight bearing left leg   Information on my medicine - ELIQUIS (apixaban)  This medication education was reviewed with me or my healthcare representative as part of my discharge preparation.  Why was Eliquis prescribed for you? Eliquis was prescribed to treat blood clots that may have been found in the veins of your legs (deep vein thrombosis) or in your lungs (pulmonary embolism) and to reduce the risk of them occurring again.  What do You need to know about Eliquis ? The starting dose is 10 mg (two 5 mg tablets) taken TWICE daily for the FIRST SEVEN (7) DAYS, then on (enter date)  10/27/2015  the dose is reduced to ONE 5 mg tablet taken TWICE daily.  Eliquis may be taken with or without food.   Try to take the dose about the same time in the morning and in the evening. If you have difficulty swallowing the tablet whole please discuss with your pharmacist how to take the medication safely.  Take Eliquis exactly as prescribed and DO NOT stop taking Eliquis without talking to the doctor who prescribed the medication.  Stopping may increase your risk of developing a new blood clot.  Refill your prescription before you run out.  After discharge, you should have regular check-up appointments with your healthcare provider that is prescribing your Eliquis.    What do you do if you miss a dose? If a dose of ELIQUIS is not taken at the scheduled time, take it as soon as possible on the same day and twice-daily administration should be resumed. The dose should not be doubled to make up for a missed dose.  Important Safety Information A possible side effect of Eliquis is bleeding. You should call your healthcare provider right away if you experience any of the following: ? Bleeding from an injury or your nose that does not stop. ? Unusual colored urine (red or dark brown) or unusual colored stools (red or black). ? Unusual bruising for unknown reasons. ? A  serious fall or if you hit your head (even if there is no bleeding).  Some medicines may interact with Eliquis and might increase your risk of bleeding or clotting while on Eliquis. To help avoid this, consult your healthcare provider or pharmacist prior to using any new prescription or non-prescription medications, including herbals, vitamins, non-steroidal anti-inflammatory drugs (NSAIDs) and supplements.  This website has more information on Eliquis (apixaban): http://www.eliquis.com/eliquis/home

## 2015-10-23 NOTE — Discharge Summary (Signed)
Physician Discharge Summary   Patient ID: [name] [name] MRN: [id number] DOB/AGE: 80-Nov-1923 80 y.o.  Admit date: 10/14/2015 Discharge date: 10/23/2015  Primary Care Physician:  [name], MD  Discharge Diagnoses:   . Acute hypoxic respiratory failure due to bilateral PE . Open fracture of left femur, type I or II, initial encounter . Bilateral pulmonary embolism  . Multiple acute embolic infarcts/CVA  . Atrial fibrillation (HCC) . dysphagia  . COPD (chronic obstructive pulmonary disease) (HCC) . HLD (hyperlipidemia) . GERD (gastroesophageal reflux disease) . chronic diastolic CHF  . anemia  . Fall . leukocytosis . Diabetes mellitus  Consults:   Orthopedics Dr. Linna Caprice Neurological- Dr Roda Shutters Palliative medicine- Dr Phillips Odor    Recommendations for Outpatient Follow-up:  1. Please start Eliquis 10 mg twice a day for 3 days starting tonight 2. Then start Eliquis 5 mg twice a day on 10/27/15 3. Please repeat CBC/BMET at next visit 4. Aspiration precautions, diet pure/dysphagia 1 5. Please have speech therapy follow patient to upgrade diet 6. Per orthopedic recommendations Knee immobilizer at all times NWB LLE PT/OT Follow-up in 2 weeks   DIET: Dysphagia 1, pure diet    Allergies: Penicillins   DISCHARGE MEDICATIONS: Current Discharge Medication List    START taking these medications   Details  !! apixaban (ELIQUIS) 5 MG TABS tablet Take 2 tablets (10 mg total) by mouth 2 (two) times daily. X 3 days Qty: 10 tablet, Refills: 0    !! apixaban (ELIQUIS) 5 MG TABS tablet Take 1 tablet (5 mg total) by mouth 2 (two) times daily. Qty: 60 tablet, Refills: 6    ipratropium-albuterol (DUONEB) 0.5-2.5 (3) MG/3ML SOLN Take 3 mLs by nebulization every 6 (six) hours as needed. Qty: 360 mL    OLANZapine (ZYPREXA) 2.5 MG tablet Take 1 tablet (2.5 mg total) by mouth at bedtime as needed and may repeat dose one time if needed (for evening agitation as needed only). Qty: 30  tablet, Refills: 0    oxyCODONE (ROXICODONE) 5 MG/5ML solution Take 2.5-5 mLs (2.5-5 mg total) by mouth every 4 (four) hours as needed for moderate pain. Qty: 30 mL, Refills: 0    senna-docusate (SENOKOT-S) 8.6-50 MG tablet Take 2 tablets by mouth at bedtime.     !! - Potential duplicate medications found. Please discuss with provider.    CONTINUE these medications which have NOT CHANGED   Details  acetaminophen (TYLENOL) 500 MG tablet Take 1,000 mg by mouth every 6 (six) hours as needed for mild pain.    bacitracin ophthalmic ointment Place 1 application into both eyes 4 (four) times daily as needed for wound care. apply to eye    citalopram (CELEXA) 10 MG tablet Take 10 mg by mouth daily.    cyanocobalamin (,VITAMIN B-12,) 1000 MCG/ML injection Inject 1,000 mcg into the muscle every 30 (thirty) days.    dorzolamide-timolol (COSOPT) 22.3-6.8 MG/ML ophthalmic solution Place 1 drop into both eyes 2 (two) times daily.    esomeprazole (NEXIUM) 40 MG capsule Take 40 mg by mouth daily before breakfast.     isosorbide mononitrate (IMDUR) 60 MG 24 hr tablet Take 60 mg by mouth daily.    levalbuterol (XOPENEX HFA) 45 MCG/ACT inhaler Inhale 1-2 puffs into the lungs every 4 (four) hours as needed for wheezing.    nitroGLYCERIN (NITROLINGUAL) 0.4 MG/SPRAY spray Place 1 spray under the tongue every 5 (five) minutes x 3 doses as needed for chest pain.    Polyethyl Glycol-Propyl Glycol (SYSTANE) 0.4-0.3 % SOLN Apply  1-2 drops to eye 4 (four) times daily as needed (dry eyes).     pravastatin (PRAVACHOL) 40 MG tablet Take 40 mg by mouth daily.    sitaGLIPtin (JANUVIA) 25 MG tablet Take 25 mg by mouth daily.    tiotropium (SPIRIVA) 18 MCG inhalation capsule Place 18 mcg into inhaler and inhale daily.    verapamil (CALAN) 40 MG tablet Take 40 mg by mouth 2 (two) times daily.      STOP taking these medications     metFORMIN (GLUCOPHAGE-XR) 500 MG 24 hr tablet      rivaroxaban (XARELTO) 20 MG  TABS tablet          Brief H and P: For complete details please refer to admission H and P, but in brief Glynnis P Cooperman is a 82 y.o. female PMH of af-fib on Xarelto, HTN, COPD, DM, s/p bilateral knee replacement by [name] 5-7 years ago, GERD, depression, congestive heart failure, who presents with left knee pain after fall. She was found to have a periprosthetic knee fracture. She was doing well up until after ORIF on 1/26 when she failed to awaken- further work up was done and this unfortunately revealed that she had developed multifocal infarcts and Pulmonary emboli in the short duration that she was off of Xarelto. She has since been slow to recover mentally and has had very poor PO intake .  Hospital Course:   Open periprosthetic femur fracture, left - Orthopedics was consulted and patient went to OR for washing on 1/24 and then underwent ORIF on 1/26 - Per orthopedic recommendations-  will need to be non-weight bearing for a number of weeks to allow the knee to heal. Knee immobilizer at all times - Appreciate palliative medicine recommendations. I discussed in detail with patient's daughter at the bedside. She is agreeable to skilled nursing facility for continued rehabilitation. However she will take her home with hospice if she declines.  Lethargy- multifocal infarcts/acute CVAs, largely involving left PCA territory, but also left cerebellar, right PCA, left MCA,, in the setting of atrial fibrillation, embolic - Patient was significantly lethargic after OR on 1/26 - CT head showed acute infarction, MRI of the brain on 1/28 showed acute infarction in the left cerebellum, acute infarction through out the left posterior cerebral artery territory affecting the posterior medial temporal lobe and occipital lobe with focal involvement of the lateral thalamus, additional acute infarction in the left parietal cortical and subcortical brain, additional subcm infarction in the left posterior frontal  deep white matter . Infarcts likely related to A-fib and occurred in the short duration she was off of Xarelto awaiting surgery, does not appear to have focal deficits in arms or right leg, has right hemianopia  -- no infection noted on UA, ammonia level normal - 2-D echo showed EF of 55-60%, grade 1 diastolic dysfunction, no PFO, carotid Dopplers showed 1-39% ICA stenosis right and left ICA -Patient was followed closely by neurology service. I also discussed in detail with Dr Roda Shutters prior to discharge who is agreeable to therapeutic dose of eliquis for pulmonary embolism at this time. Patient had tolerated heparin drip well. - She will follow up with Dr Roda Shutters outpatient    Acute hypoxic resp failure due to b/l PE, left leg DVT --Patient was noted to be hypoxic after surgery. CXR did not reveal an etiology, CT chest showed Subacute b/l PEs  - despite being on DVT prophylaxis, PE's occurred in the short period she was off of anticoagulation .  Patient has history of b/l PE in the past as well even while on Pradaxa - have ordred hypercoag panel (mainly to see if she has a genetic predisposition that could be inherited by her family) - will not order Protein C, S, Anti-thrombin as these will be inaccurate in setting of acute PE and heparin use - Lupus anticoagulation slightly elevated,would need to repeat in 4 months  - venous duplex reveals left leg DVT - hemodynamically stable- no dyspnea or chest pain. O2 sats 97% on 3 L - Patient was placed on heparin drip, tolerated well. Okay to place on therapeutic dose of eliquis per neurology.   Dehydration, anorexia - poor oral intake, continue gentle hydration - Speech therapy following, appreciate palliative medicine recommendations - Continue dysphagia 1 Diet   Chronic diastolic CHF (congestive heart failure) (HCC):  - 2-D echo showed EF of 55-60% with grade 1 diastolic dysfunction - CHF is compensated.  - Hold Lasix for now as she is compensated  and she has minimal PO intake of fluid  Anemia due to acute blood loss - Hb dropped from 12 to 8.9 (due to acute blood loss from knee fracture?) given 1 U PRBC by ortho prior to surgery  - H/H stable. Currently hemoglobin 9.9 at the time of discharge.  HTN -Currently stable  Leukocytosis:  - Likely due to stress-induced demargination, cannot rule out aspiration - Currently not spiking any fevers. Chest x-ray on 2/1 showed no radiographic evidence of acute cardiopulmonary disease.  Atrial Fibrillation:  CHA2DS2-VASc Score is 6- Patient was on Xarelto at home.  - resumed Xarelto on the day after surgery as recommended by Ortho but due to acute PE's heparin was started. Patient will continueeliquis (therapeutic dose for pulmonary embolism).   DM-II:  - Last A1c not on record. Patient is taking metformin and Januvia at home -Patient was placed on sliding scale insulin while inpatient. She can continue Januvia outpatient. - A1c is 8.3  COPD:  - stable. -Continue Xopenex inhaler, Spiriva inhaler  HLD: - Last LDL was not on record- current LDL is 121 -Continue home medications: Pravastatin  GERD: -Protonix  Depression:  -Stable, no suicidal or homicidal ideations. -Continue home medications: Celexa   Day of Discharge BP 133/74 mmHg  Pulse 144  Temp(Src) 97.9 F (36.6 C) (Oral)  Resp 23  Ht 5\' 3"  (1.6 m)  Wt 70.1 kg (154 lb 8.7 oz)  BMI 27.38 kg/m2  SpO2 97%  Physical Exam: General: Alert and awake oriented x1, much more communicative today not in any acute distress. HEENT: anicteric sclera, pupils reactive to light and accommodation CVS: S1-S2 clear no murmur rubs or gallops Chest: clear to auscultation bilaterally, no wheezing rales or rhonchi Abdomen: soft nontender, nondistended, normal bowel sounds Extremities: no cyanosis, clubbing, left leg in immobilizer    The results of significant diagnostics from this hospitalization (including imaging,  microbiology, ancillary and laboratory) are listed below for reference.    LAB RESULTS: Basic Metabolic Panel:  Recent Labs Lab 10/21/15 0415 10/22/15 0625  NA 137 137  K 2.8* 3.8  CL 102 103  CO2 24 20*  GLUCOSE 201* 170*  BUN 8 8  CREATININE 0.74 0.69  CALCIUM 8.0* 8.3*  MG  --  1.8   Liver Function Tests: No results for input(s): AST, ALT, ALKPHOS, BILITOT, PROT, ALBUMIN in the last 168 hours. No results for input(s): LIPASE, AMYLASE in the last 168 hours.  Recent Labs Lab 10/18/15 1738  AMMONIA 25   CBC:  Recent Labs Lab 10/22/15 0625 10/23/15 0426  WBC 13.3* 13.1*  HGB 10.0* 9.9*  HCT 32.3* 32.4*  MCV 81.6 81.4  PLT 345 370   Cardiac Enzymes: No results for input(s): CKTOTAL, CKMB, CKMBINDEX, TROPONINI in the last 168 hours. BNP: Invalid input(s): POCBNP CBG:  Recent Labs Lab 10/22/15 1725 10/22/15 2105  GLUCAP 108* 117*    Significant Diagnostic Studies:  Ct Head Wo Contrast  10/14/2015  CLINICAL DATA:  Witnessed fall at home. Did not hit head. Left lower femoral fracture. EXAM: CT HEAD WITHOUT CONTRAST CT CERVICAL SPINE WITHOUT CONTRAST TECHNIQUE: Multidetector CT imaging of the head and cervical spine was performed following the standard protocol without intravenous contrast. Multiplanar CT image reconstructions of the cervical spine were also generated. COMPARISON:  None. FINDINGS: CT HEAD FINDINGS Diffuse cerebral atrophy. Ventricular dilatation consistent with central atrophy. Low-attenuation changes in the deep white matter consistent with small vessel ischemia. No mass effect or midline shift. No abnormal extra-axial fluid collections. Gray-white matter junctions are distinct. Basal cisterns are not effaced. No evidence of acute intracranial hemorrhage. No depressed skull fractures. Partial opacification of some of the mastoid air cells bilaterally. Visualized paranasal sinuses are not opacified. Vascular calcifications. CT CERVICAL SPINE FINDINGS  Diffuse bone demineralization. Normal alignment of the cervical spine. Degenerative changes throughout with narrowed cervical interspaces and associated endplate hypertrophic changes. Degenerative changes in the facet joints. No vertebral compression deformities. No prevertebral soft tissue swelling. Benign-appearing cystic lesion in the right lateral mass of C5. C1-2 articulation appears intact. Interstitial infiltrates in the lung apices may indicate edema. Soft tissues are unremarkable. Vascular calcifications. IMPRESSION: No acute intracranial abnormalities. Chronic atrophy and small vessel ischemic changes. Normal alignment of the cervical spine. Diffuse degenerative changes. No acute bony abnormalities. Electronically Signed   By: Burman Nieves M.D.   On: 10/14/2015 02:06   Ct Cervical Spine Wo Contrast  10/14/2015  CLINICAL DATA:  Witnessed fall at home. Did not hit head. Left lower femoral fracture. EXAM: CT HEAD WITHOUT CONTRAST CT CERVICAL SPINE WITHOUT CONTRAST TECHNIQUE: Multidetector CT imaging of the head and cervical spine was performed following the standard protocol without intravenous contrast. Multiplanar CT image reconstructions of the cervical spine were also generated. COMPARISON:  None. FINDINGS: CT HEAD FINDINGS Diffuse cerebral atrophy. Ventricular dilatation consistent with central atrophy. Low-attenuation changes in the deep white matter consistent with small vessel ischemia. No mass effect or midline shift. No abnormal extra-axial fluid collections. Gray-white matter junctions are distinct. Basal cisterns are not effaced. No evidence of acute intracranial hemorrhage. No depressed skull fractures. Partial opacification of some of the mastoid air cells bilaterally. Visualized paranasal sinuses are not opacified. Vascular calcifications. CT CERVICAL SPINE FINDINGS Diffuse bone demineralization. Normal alignment of the cervical spine. Degenerative changes throughout with narrowed  cervical interspaces and associated endplate hypertrophic changes. Degenerative changes in the facet joints. No vertebral compression deformities. No prevertebral soft tissue swelling. Benign-appearing cystic lesion in the right lateral mass of C5. C1-2 articulation appears intact. Interstitial infiltrates in the lung apices may indicate edema. Soft tissues are unremarkable. Vascular calcifications. IMPRESSION: No acute intracranial abnormalities. Chronic atrophy and small vessel ischemic changes. Normal alignment of the cervical spine. Diffuse degenerative changes. No acute bony abnormalities. Electronically Signed   By: Burman Nieves M.D.   On: 10/14/2015 02:06   Dg Pelvis Portable  10/14/2015  CLINICAL DATA:  Status post fall, with concern for pelvic injury. Initial encounter. EXAM: PORTABLE PELVIS 1-2 VIEWS COMPARISON:  None. FINDINGS: There is no  evidence of fracture or dislocation. Both femoral heads are seated normally within their respective acetabula. The patient is status post vertebroplasty at the lower lumbar spine. Mild surrounding degenerative change is noted. The sacroiliac joints are unremarkable in appearance. The visualized bowel gas pattern is grossly unremarkable in appearance. Scattered vascular calcifications are seen. IMPRESSION: No evidence of fracture or dislocation. Electronically Signed   By: Roanna Raider M.D.   On: 10/14/2015 01:18   Ct Femur Left Wo Contrast  10/14/2015  CLINICAL DATA:  Witnessed fall at home. Left lower femoral fracture on plain radiographs. EXAM: CT OF THE LEFT FEMUR WITHOUT CONTRAST TECHNIQUE: Multidetector CT imaging was performed according to the standard protocol. Multiplanar CT image reconstructions were also generated. COMPARISON:  Left knee 10/14/2015 FINDINGS: Left total knee arthroplasty. Acute comminuted fractures of the distal left femoral metaphysis. Fracture lines extend to the superior portion of the femoral prosthesis. There is anterior  displacement and angulation of the distal fracture fragments with multiple displaced and angulated butterfly fragments. We bone femoral head fragment is rotated anteriorly. Multiple displaced cortical fragments are present with overriding and impaction of fracture fragments. Scattered gas collections are demonstrated in about the fracture fragments and infiltrating into the subcutaneous fat into the adjacent muscle compartments anteriorly and posteriorly. Gas within the fracture is consistent with open fracture. A displaced bone fragment is demonstrated along the superolateral aspect of the left lower leg at the level of the knee with extension to the skin surface consistent with a penetrating fragment. Small left knee effusion. Soft tissue infiltration in the subcutaneous fat and muscles around the left kidney consistent with edema or hematomas. Prominent vascular calcifications. No dislocation of the left hip. Visualization of the left knee is limited due to streak artifact arising from the femoral prosthesis. IMPRESSION: Acute comminuted fractures of the distal left femoral metaphysis extending to the base of the femoral component of the knee arthroplasty. Diffuse soft tissue gas is present with a displaced bone fragment demonstrated just beneath the skin surface consistent with an open fracture. Electronically Signed   By: Burman Nieves M.D.   On: 10/14/2015 02:12   Dg Chest Port 1 View  10/15/2015  CLINICAL DATA:  Chest pain and hypoxemia. Atrial fibrillation. Patient fell from standing position yesterday. EXAM: PORTABLE CHEST 1 VIEW COMPARISON:  10/14/2015 FINDINGS: Mild cardiomegaly and ectasia of the thoracic aorta are stable. No pneumothorax or pleural effusion visualized. Mild linear opacity in left lung base may be due to atelectasis or scarring. No evidence of pulmonary consolidation or edema. Old left medial clavicle fracture deformity noted. IMPRESSION: Mild cardiomegaly and left basilar  atelectasis versus scarring. No evidence of pneumothorax or hemothorax. Electronically Signed   By: Myles Rosenthal M.D.   On: 10/15/2015 12:03   Dg Chest Portable 1 View  10/14/2015  CLINICAL DATA:  Status post fall, with concern for chest injury. Initial encounter. EXAM: PORTABLE CHEST 1 VIEW COMPARISON:  None. FINDINGS: The lungs are well-aerated. Mild peribronchial thickening is noted. There is no evidence of focal opacification, pleural effusion or pneumothorax. The cardiomediastinal silhouette is mildly enlarged. No acute osseous abnormalities are seen. IMPRESSION: Mild peribronchial thickening noted. Mild cardiomegaly. Lungs otherwise grossly clear. No displaced rib fracture seen. Electronically Signed   By: Roanna Raider M.D.   On: 10/14/2015 01:17   Dg Knee Left Port  10/14/2015  CLINICAL DATA:  Trauma.  Fall.  Open left knee injury. EXAM: PORTABLE LEFT KNEE - 1-2 VIEW COMPARISON:  None. FINDINGS: Left total  knee arthroplasty with patellar femoral component. There is an acute comminuted and displaced fracture of the distal left femoral metaphysis with fracture lines extending to the base of the femoral component of the arthroplasty. There is anterior angulation of the major distal fracture fragments with anterior and medial displacement of fracture fragments. Medial overriding of fracture fragments. Several displaced butterfly fragments are present. Calcifications in the suprapatellar space appear to be well corticated and may be old loose bodies. Small effusion. Proximal tibia and fibula appear intact. IMPRESSION: Acute comminuted and displaced fractures of the distal left femoral metaphysis extending to the base of the to femoral component of the left knee arthroplasty. Electronically Signed   By: Burman Nieves M.D.   On: 10/14/2015 01:19   Dg Femur Port Min 2 Views Left  10/14/2015  CLINICAL DATA:  Trauma.  [employer].  Left open knee injury. EXAM: LEFT FEMUR PORTABLE 2 VIEWS COMPARISON:   None. FINDINGS: Acute comminuted and displaced fractures of the distal left radial metaphysis extending to the base of the femoral component of a left knee arthroplasty. See additional report of left knee same date. Limited view of the proximal left femur appears intact. No gross evidence of dislocation of the left hip. Vascular calcifications. IMPRESSION: Acute comminuted and displaced fractures of the distal left radial metaphysis. See additional report of left knee. Proximal left femur is grossly intact. Electronically Signed   By: Burman Nieves M.D.   On: 10/14/2015 01:23    2D ECHO: Study Conclusions  - Left ventricle: The cavity size was normal. There was mild concentric hypertrophy. Systolic function was normal. The estimated ejection fraction was in the range of 55% to 60%. Wall motion was normal; there were no regional wall motion abnormalities. Doppler parameters are consistent with abnormal left ventricular relaxation (grade 1 diastolic dysfunction). - Ventricular septum: The contour showed diastolic flattening consistent with elevated right sided heart pressure (prior PE). - Aortic valve: Trileaflet; mildly thickened, mildly calcified leaflets. - Mitral valve: Calcified annulus. There was mild regurgitation. - Right ventricle: The cavity size was mildly dilated. Wall thickness was normal. - Right atrium: The atrium was mildly dilated. - Tricuspid valve: There was moderate regurgitation. - Pulmonary arteries: Systolic pressure was mildly increased. PA peak pressure: 44 mm Hg (S).  Disposition and Follow-up: Discharge Instructions    Ambulatory referral to Neurology    Complete by:  As directed   Pt will follow up with Dr. Roda Shutters at [facility] in about 2 months. Thanks.     Discharge instructions    Complete by:  As directed   Diet: Puree diet     Increase activity slowly    Complete by:  As directed             DISPOSITION: Skilled nursing  facility   DISCHARGE FOLLOW-UP Follow-up Information    Follow up with Swinteck, [name], MD. Schedule an appointment as soon as possible for a visit in 2 weeks.   Specialty:  Orthopedic Surgery   Why:  For wound re-check, For suture removal   Contact information:   [address] [place] [address] [phone]       Follow up with Xu,Jindong, MD. Schedule an appointment as soon as possible for a visit in 2 months.   Specialty:  Neurology   Why:  stroke clinic   Contact information:   [address] [place] [address] [phone]       Follow up with [name], MD.  Schedule an appointment as soon as possible for a visit in 2 weeks.   Specialty:  Internal Medicine   Why:  for hospital follow-up   Contact information:   [address] [place] [address] [phone]        Time spent on Discharge: 35 minutes  Signed:   Mikaelyn Arthurs M.D. [facility] 10/23/2015, 12:05 PM Pager: [phone]

## 2015-10-23 NOTE — Evaluation (Signed)
Occupational Therapy Evaluation Patient Details Name: [name] [name] MRN: [id number] DOB: 1922-08-27 Today's Date: 10/23/2015    History of Present Illness Pt admitted after fall with L femur fx s/p IM nail.  Pt presented w/ AMS during hospital stay and imaging revealed Lt PCA, Rt PCA, and Lt MCA infarcts. PMHx: Afib, HTN, COPD, DM, s/p bilateral knee replacement , GERD, depression, congestive heart failure.  Acute Infarct also found on MRI in L cerebelum, L PCA terrirory and L parietal lobe   Clinical Impression   Pt admitted with the above diagnosis and has the deficits listed below. Pt would benefit from a trial of OT to see if she can increase her independence with basic adls in order to eventually dc home with her daughter and decrease the burden of care on her.  Daughter is available to take care of pt at home but at this time pt requires more than +1 assist.     Follow Up Recommendations  SNF;Supervision/Assistance - 24 hour    Equipment Recommendations  Other (comment) (to be determined based on palliative care status.)    Recommendations for Other Services       Precautions / Restrictions Precautions Precautions: Fall Required Braces or Orthoses: Knee Immobilizer - Left Knee Immobilizer - Left: On at all times Restrictions Weight Bearing Restrictions: Yes LLE Weight Bearing: Non weight bearing Other Position/Activity Restrictions: Pt with heavy R lean      Mobility Bed Mobility Overal bed mobility: Needs Assistance;+2 for physical assistance Bed Mobility: Supine to Sit     Supine to sit: Total assist;+2 for physical assistance     General bed mobility comments: Pt initiates supine>sit but requires total assist +2 w/ support provided to trunk and assist managing Bil LEs w/ use of bed pad for positioning for supine<>sit  Transfers Overall transfer level: Needs assistance Equipment used: 2 person hand held assist Transfers: Sit to/from Stand;Stand Pivot  Transfers Sit to Stand: Max assist Stand pivot transfers: Max assist;+2 physical assistance       General transfer comment: pt got up onto feet with +1 max assist but was unable to maintain NWB status therefore +2 was required.      Balance Overall balance assessment: History of Falls;Needs assistance Sitting-balance support: Bilateral upper extremity supported;Feet supported Sitting balance-Leahy Scale: Poor Sitting balance - Comments: Pt with heavy R lean when sitting Postural control: Right lateral lean Standing balance support: Bilateral upper extremity supported;During functional activity Standing balance-Leahy Scale: Zero Standing balance comment: Pt requires full total +2 assist to stand and unable to maintain NWB status at this point.                            ADL Overall ADL's : Needs assistance/impaired Eating/Feeding: Maximal assistance;Sitting Eating/Feeding Details (indicate cue type and reason): Pt too fatigued and not interested in feeding self. Surgeon did talk to pt about needing to eat to have energy to do therapy. Grooming: Wash/dry hands;Wash/dry face;Oral care;Moderate assistance;Sitting Grooming Details (indicate cue type and reason): Pt can start the activity but becomes fatigued and distracted quickly. Upper Body Bathing: Maximal assistance;Sitting Upper Body Bathing Details (indicate cue type and reason): poor sitting balance. Lower Body Bathing: Total assistance;+2 for physical assistance;Sit to/from stand Lower Body Bathing Details (indicate cue type and reason): +2 to stand.  Pt unable to bathe in sitting due to poor balance.  In chair pt requires max assist. Upper Body Dressing : Moderate assistance;Sitting Upper  Body Dressing Details (indicate cue type and reason): Pt falling asleep at times during eval therefore requires more assist. Lower Body Dressing: Total assistance;+2 for physical assistance;Sit to/from stand Lower Body Dressing Details  (indicate cue type and reason): pt unable to maintain NWB status on the Left during any mobility. Toilet Transfer: Total assistance;+2 for physical assistance;BSC Toilet Transfer Details (indicate cue type and reason): pivot to [facility] with +2 assist. Toileting- Clothing Manipulation and Hygiene: Total assistance;+2 for physical assistance;Sit to/from stand Toileting - Clothing Manipulation Details (indicate cue type and reason): +1 to remain standing with pt and +2 needed to clean pt and manage clothes.     Functional mobility during ADLs: Total assistance;+2 for physical assistance;Cueing for sequencing General ADL Comments: Pt more arousable today but continues to require a great amount of assist for adls due to NWB status and poor trunkal control with a heavy R lean.  Pt is total assist for all LE adls and mod assist for UE adls.     Vision Additional Comments: Did not test on this date.  Pt appears to see to L and to the R w/o difficulty but needs to be further asssessed.   Perception Perception Perception Tested?: No   Praxis Praxis Praxis tested?: Not tested    Pertinent Vitals/Pain Pain Assessment: Faces Pain Score: 4  Faces Pain Scale: Hurts little more Pain Location: L leg Pain Descriptors / Indicators: Grimacing;Guarding Pain Intervention(s): Limited activity within patient's tolerance;Premedicated before session;Repositioned     Hand Dominance Right   Extremity/Trunk Assessment Upper Extremity Assessment Upper Extremity Assessment: RUE deficits/detail RUE Deficits / Details: appears slightly weaker than L just when watching pt move. pt unable to complete full MMT due to cognition.  Strength 4-/5 thourghout.   Lower Extremity Assessment Lower Extremity Assessment: Defer to PT evaluation   Cervical / Trunk Assessment Cervical / Trunk Assessment: Other exceptions Cervical / Trunk Exceptions: weak trunk with heavy R lean.   Communication Communication Communication:  HOH   Cognition Arousal/Alertness: Lethargic Behavior During Therapy: Flat affect Overall Cognitive Status: Impaired/Different from baseline Area of Impairment: Orientation;Attention;Memory;Awareness;Problem solving Orientation Level: Disoriented to;Situation Current Attention Level: Focused Memory: Decreased short-term memory Following Commands: Follows one step commands inconsistently;Follows one step commands with increased time Safety/Judgement: Decreased awareness of safety;Decreased awareness of deficits Awareness: Intellectual Problem Solving: Slow processing;Decreased initiation;Difficulty sequencing;Requires verbal cues;Requires tactile cues General Comments: Pt very HOH so difficult to know if cognitive or hearing related issues.   General Comments       Exercises       Shoulder Instructions      Home Living Family/patient expects to be discharged to:: Skilled nursing facility Living Arrangements: Children Available Help at Discharge: Family;Available PRN/intermittently Type of Home: House Home Access: Stairs to enter [employer] of Steps: 2   Home Layout: One level     Bathroom Shower/Tub: Producer, television/film/video: Handicapped height     Home Equipment: Cane - single point          Prior Functioning/Environment Level of Independence: Independent with assistive device(s)        Comments: uses cane for gait, independent with all ADLs, drove car on their property but not on road    OT Diagnosis: Generalized weakness;Cognitive deficits;Acute pain   OT Problem List: Decreased strength;Decreased activity tolerance;Impaired balance (sitting and/or standing);Decreased cognition;Decreased safety awareness;Decreased knowledge of use of DME or AE;Decreased knowledge of precautions;Pain   OT Treatment/Interventions: Self-care/ADL training;DME and/or AE instruction;Therapeutic activities  OT Goals(Current goals can be found in the care plan  section) Acute Rehab OT Goals Patient Stated Goal: none stated OT Goal Formulation: With patient/family Time For Goal Achievement: 11/06/15 Potential to Achieve Goals: Fair  OT Frequency: Min 2X/week   Barriers to D/C: Decreased caregiver support  lives with daugther only but will need more assist than that at this time.       Co-evaluation              End of Session Equipment Utilized During Treatment: Left knee immobilizer Nurse Communication: Mobility status;Weight bearing status  Activity Tolerance: Patient limited by lethargy Patient left: in chair;with call bell/phone within reach;with family/visitor present   Time: 0900-0930 OT Time Calculation (min): 30 min Charges:  OT General Charges $OT Visit: 1 Procedure OT Evaluation $OT Eval Moderate Complexity: 1 Procedure OT Treatments $Self Care/Home Management : 8-22 mins G-Codes:    Hope Budds 10-25-2015, 9:52 AM  [phone]

## 2015-10-23 NOTE — Progress Notes (Signed)
Received phone call from daughter [name] (calling from [phone]). She states patient is about to be discharged to [facility] but that she is due to go to a part of the facility the family doesn't like and that they did not agree to this. I called LCSW Genelle Bal, and she will call [name] to discuss this. Donn Pierini, RN, BSN, [facility] 10/23/2015 4:22 PM

## 2015-10-23 NOTE — Progress Notes (Signed)
Nutrition Follow-up  DOCUMENTATION CODES:   Not applicable  INTERVENTION:    Try Mighty Shakes TID with meals, each supplement provides 500 calories and 23 gm protein.  NUTRITION DIAGNOSIS:   Inadequate oral intake related to lethargy/confusion as evidenced by meal completion < 50%.  Ongoing  GOAL:   Patient will meet greater than or equal to 90% of their needs  Unmet  MONITOR:   PO intake, Supplement acceptance, I & O's, Labs  ASSESSMENT:   82 y.o. female PMH of af-fib on Xarelto, HTN, COPD, DM, s/p bilateral knee replacement by [name] 5-7 years ago, GERD, depression, congestive heart failure, who presents with left knee pain after fall. She is found to have a femur fracture, s/p repair.   Patient's daughter reports that she only had a few bites and sips of breakfast and Ensure supplements today. She does not like magic cups. She does not like the Ensure supplements, but daughter is encouraging intake. Will try Mighty Shakes with meals.   Diet Order:  DIET - DYS 1 Room service appropriate?: Yes; Fluid consistency:: Thin  Skin:  Reviewed, no issues  Last BM:  1/31  Height:   Ht Readings from Last 1 Encounters:  10/23/15  (1.6 m)    Weight:   Wt Readings from Last 1 Encounters:  10/23/15 154 lb 8.7 oz (70.1 kg)    Ideal Body Weight:  52.2 kg  BMI:  Body mass index is 27.38 kg/(m^2).  Estimated Nutritional Needs:   Kcal:  1400-1600  Protein:  70-80 grams  Fluid:  > 1.5 L/day  EDUCATION NEEDS:   No education needs identified at this time  Joaquin Courts, RD, LDN, CNSC Pager [phone] After Hours Pager [phone]

## 2015-10-23 NOTE — Progress Notes (Signed)
Called report to [facility], pt going to room 405. daugther Marcelle aware of room number at [facility]. Waiting on EMS for transport.

## 2015-10-23 NOTE — Progress Notes (Signed)
Pt going to [facility] nursing home in [place], [place] here to transport her to [facility]. F/u appt given to pt's daugther, prescriptions sent with ambulance

## 2015-10-24 ENCOUNTER — Encounter (HOSPITAL_COMMUNITY): Payer: Self-pay | Admitting: Emergency Medicine

## 2015-10-24 LAB — PROTHROMBIN GENE MUTATION

## 2015-12-25 ENCOUNTER — Ambulatory Visit: Payer: Medicare (Managed Care) | Admitting: Neurology

## 2016-02-19 DEATH — deceased

## 2017-05-13 IMAGING — CT CT HEAD W/O CM
4 of 5 series · 15 of 47 positions shown, 16 images · non-contrast
Comparison: None.

CLINICAL DATA: Witnessed fall at home. Did not hit head. Left lower
femoral fracture.

EXAM:
CT HEAD WITHOUT CONTRAST
CT CERVICAL SPINE WITHOUT CONTRAST
TECHNIQUE: Multidetector CT imaging of the head and cervical spine was
performed following the standard protocol without intravenous
contrast. Multiplanar CT image reconstructions of the cervical spine
were also generated.

[Series 2: head without · axial · non-contrast · 0.41mm/px · z∈[-125,-75]mm · 2 of 31 slices shown, 3 images]
[im 11/31  brain]
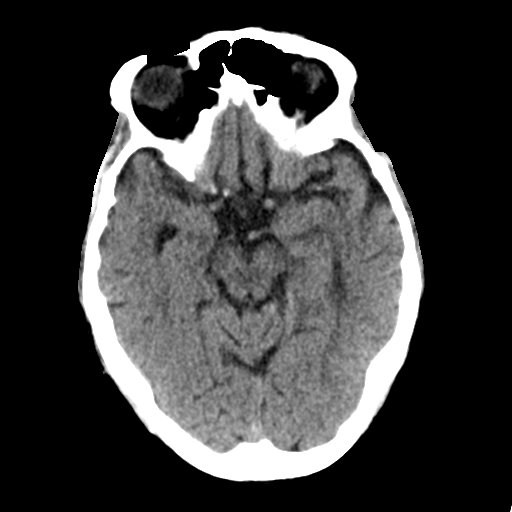
[im 11/31  bone]
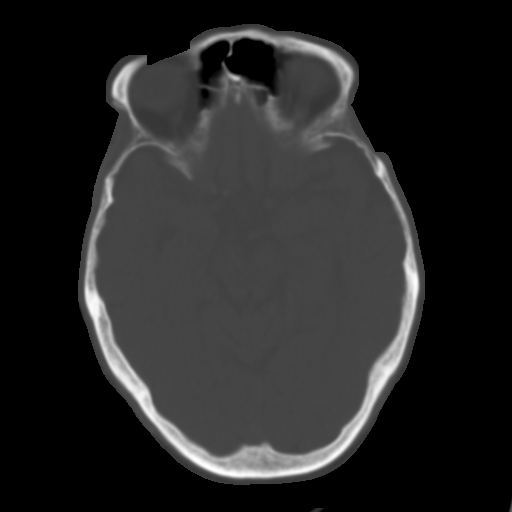
[im 21/31  brain]
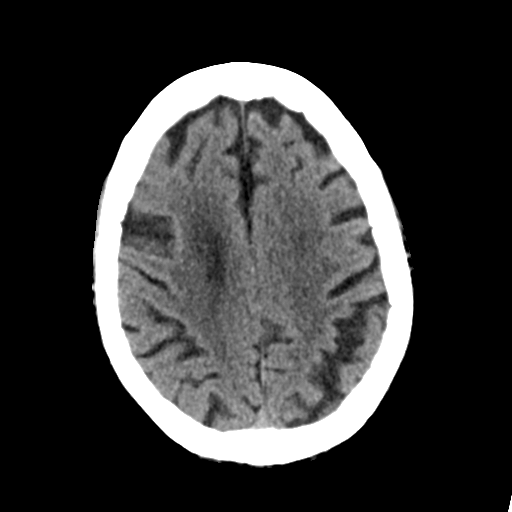

[Series 3: head bone · axial · 0.41mm/px · z∈[-163,-53]mm · 7 of 76 slices shown]
[im 7/76  bone]
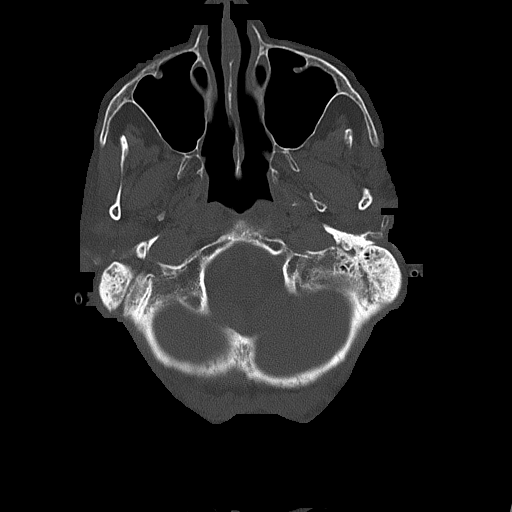
[im 14/76  bone]
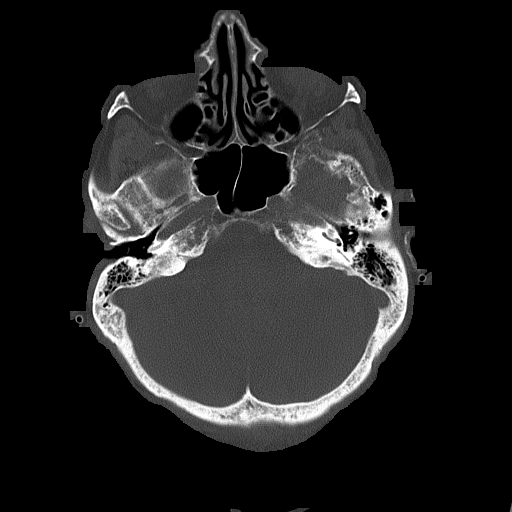
[im 28/76  bone]
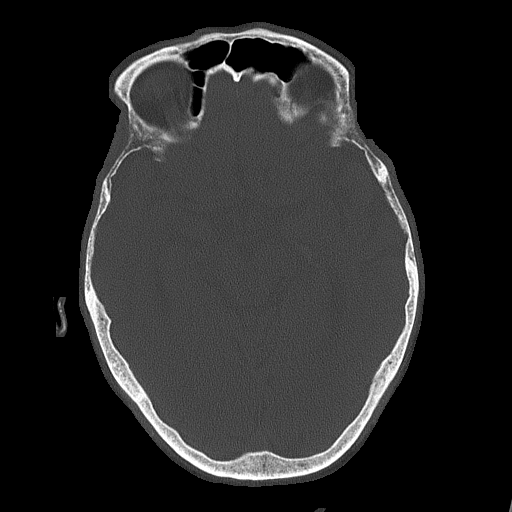
[im 35/76  bone]
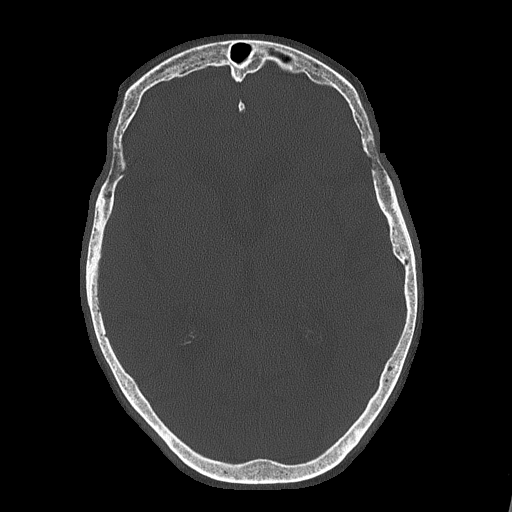
[im 41/76  bone]
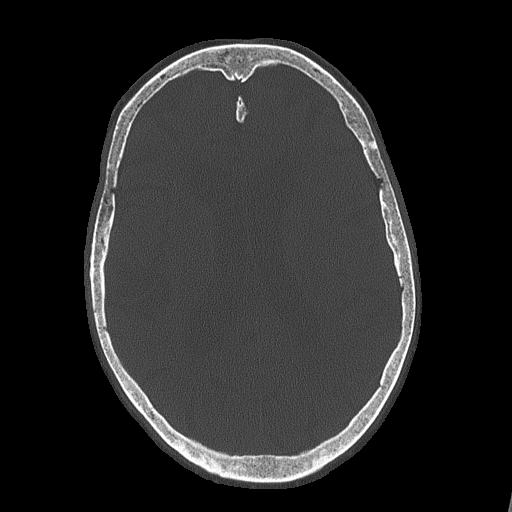
[im 48/76  bone]
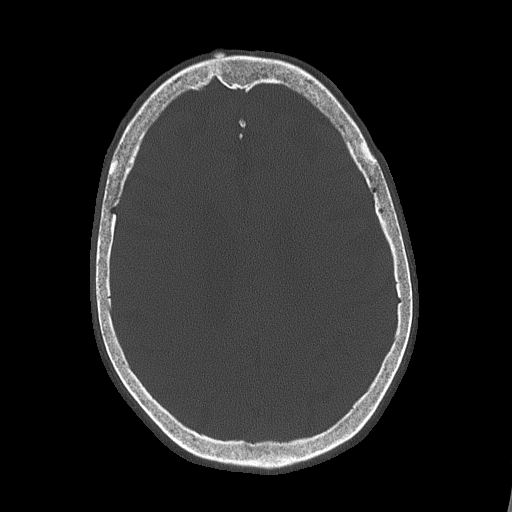
[im 62/76  bone]
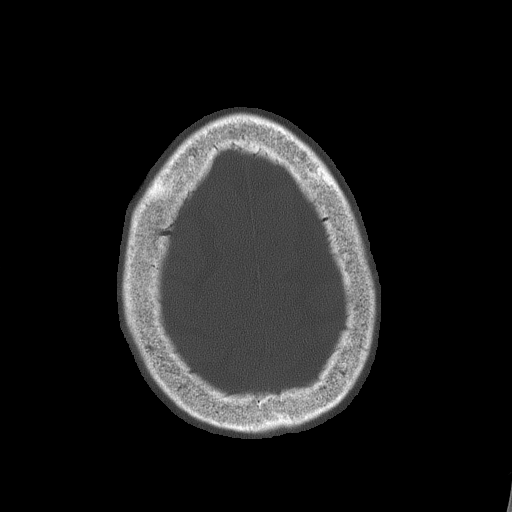

[Series 7: c_spine 2.0 sag bone · sagittal · 0.29mm/px · 3 of 42 slices shown]
[im 14/42  brain]
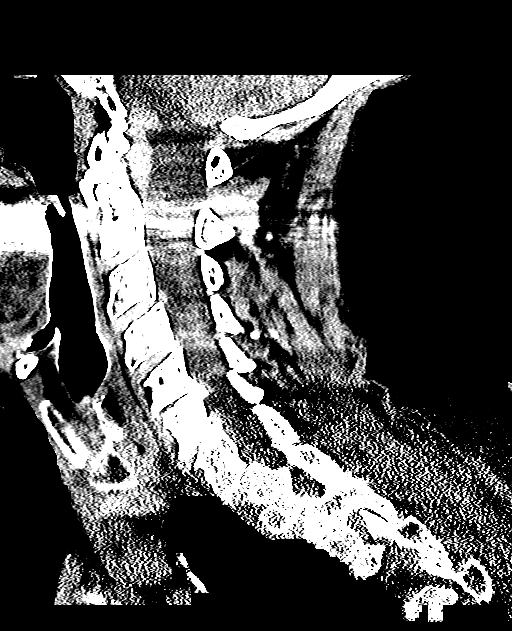
[im 21/42  brain]
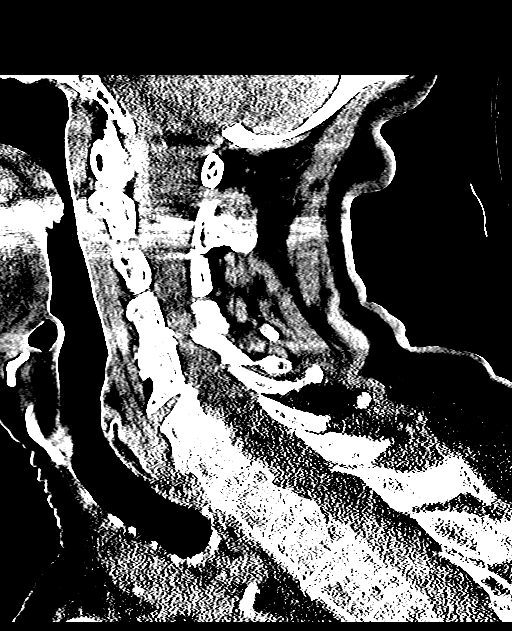
[im 28/42  brain]
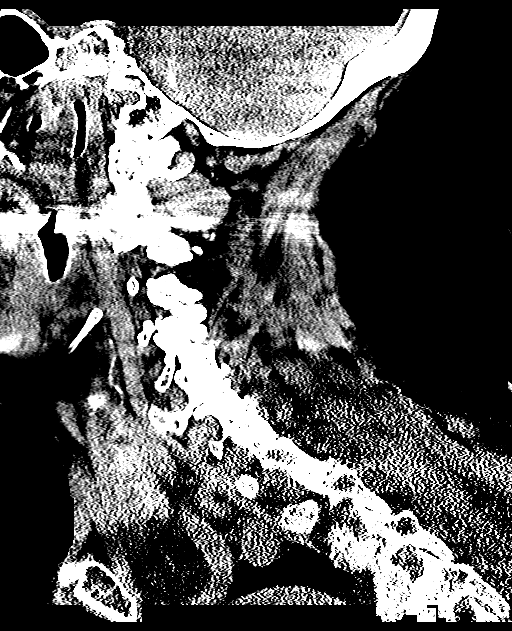

[Series 8: c_spine 2.0 cor bone · coronal · 0.24mm/px · 3 of 61 slices shown]
[im 21/61  brain]
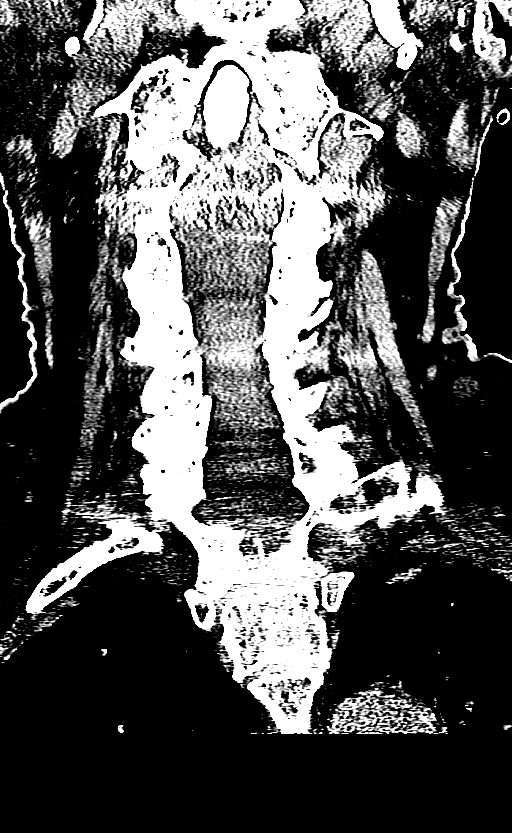
[im 27/61  brain]
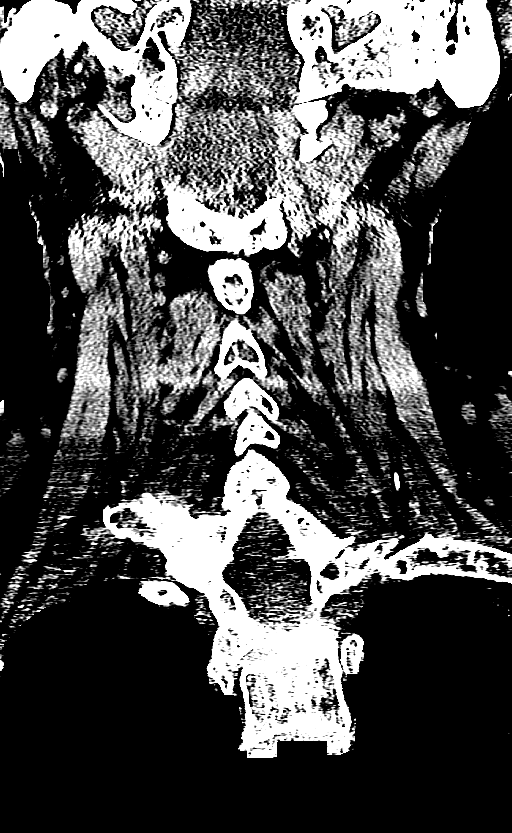
[im 34/61  brain]
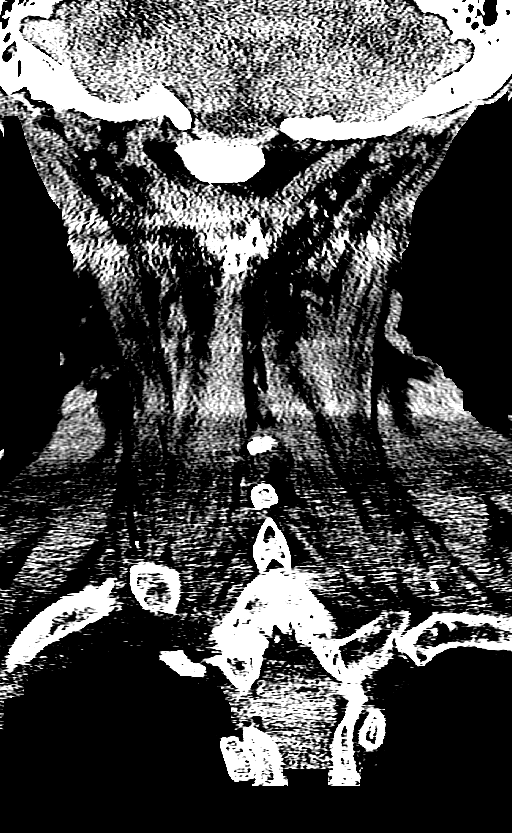

[15 of 47 positions shown; findings below may reference images not displayed]

FINDINGS: CT HEAD FINDINGS

Diffuse cerebral atrophy. Ventricular dilatation consistent with
central atrophy. Low-attenuation changes in the deep white matter
consistent with small vessel ischemia. No mass effect or midline
shift. No abnormal extra-axial fluid collections. Gray-white matter
junctions are distinct. Basal cisterns are not effaced. No evidence
of acute intracranial hemorrhage. No depressed skull fractures.
Partial opacification of some of the mastoid air cells bilaterally.
Visualized paranasal sinuses are not opacified. Vascular
calcifications.

CT CERVICAL SPINE FINDINGS

Diffuse bone demineralization. Normal alignment of the cervical
spine. Degenerative changes throughout with narrowed cervical
interspaces and associated endplate hypertrophic changes.
Degenerative changes in the facet joints. No vertebral compression
deformities. No prevertebral soft tissue swelling. Benign-appearing
cystic lesion in the right lateral mass of C5. C1-2 articulation
appears intact. Interstitial infiltrates in the lung apices may
indicate edema. Soft tissues are unremarkable. Vascular
calcifications.
IMPRESSION: No acute intracranial abnormalities. Chronic atrophy and small
vessel ischemic changes.

Normal alignment of the cervical spine. Diffuse degenerative
changes. No acute bony abnormalities.

## 2017-05-13 IMAGING — CR DG FEMUR 2+V PORT*L*
2 series · 2 of 2 positions shown · non-contrast
Comparison: None.

CLINICAL DATA: Trauma.  Fell tonight.  Left open knee injury.

EXAM:
LEFT FEMUR PORTABLE 2 VIEWS

[AP]
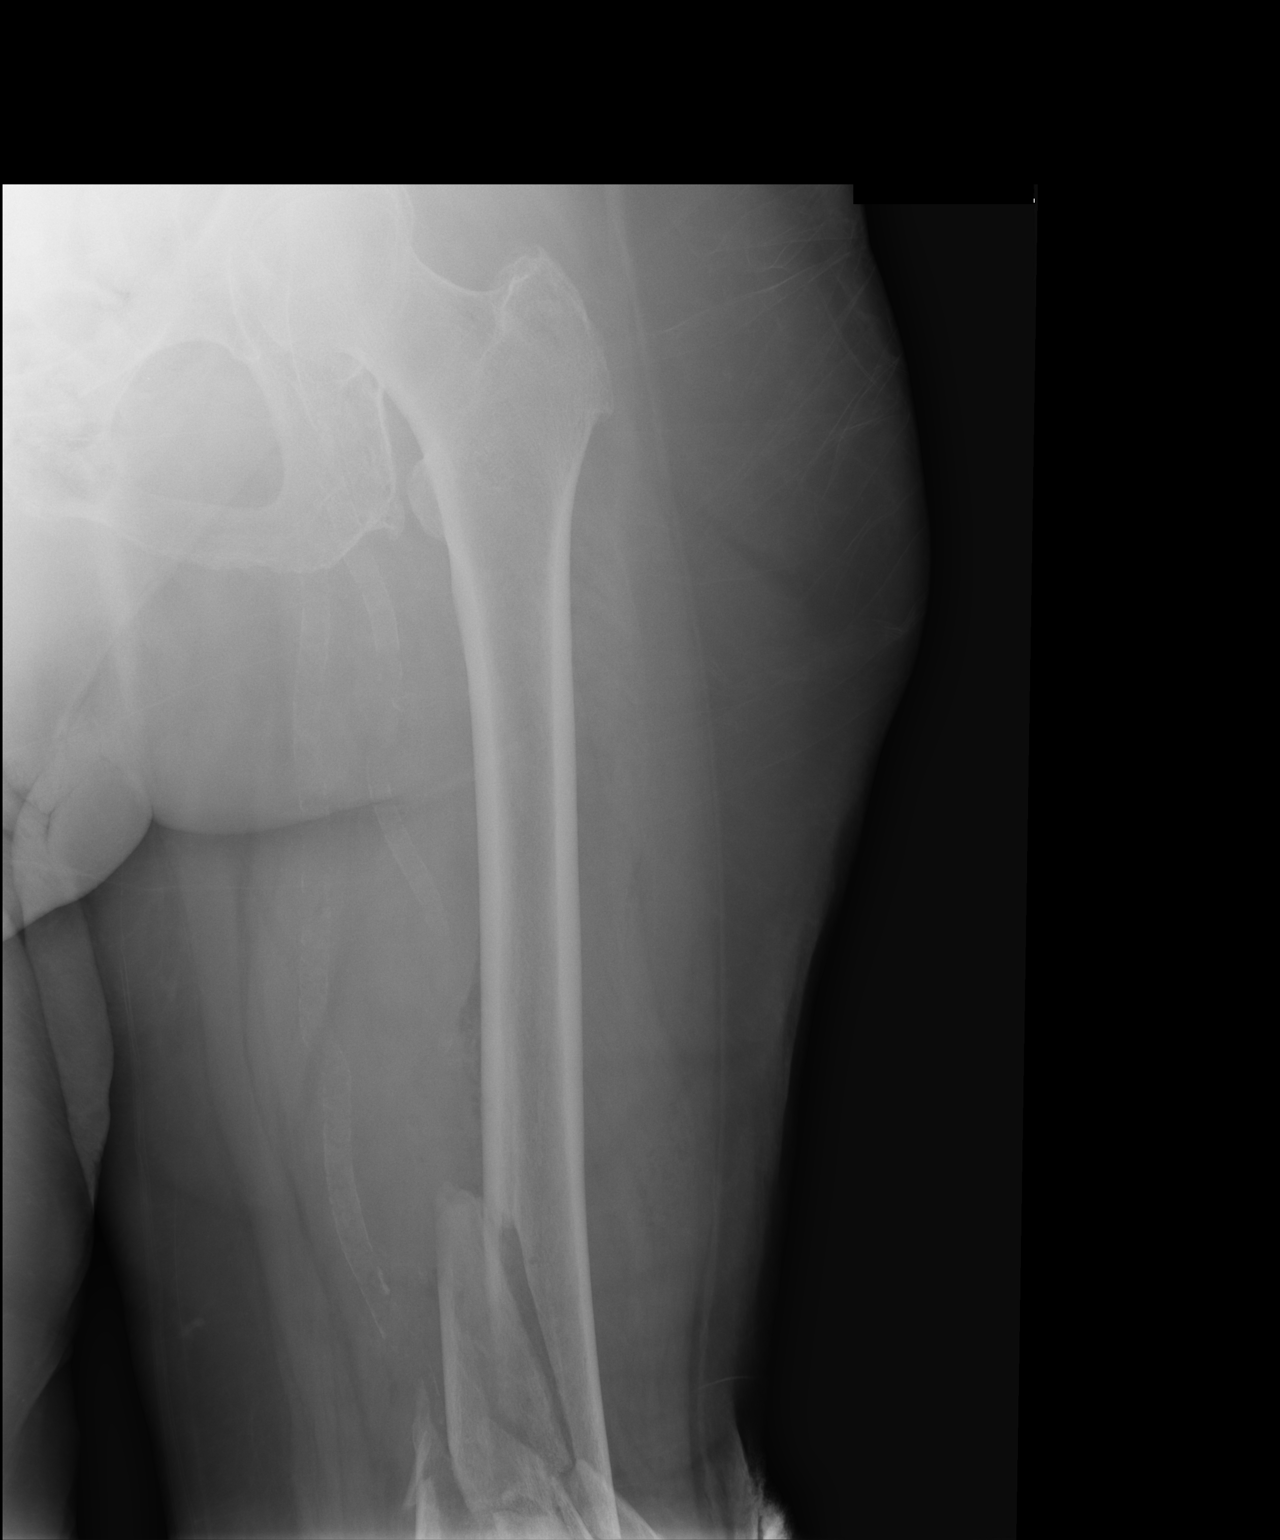

[xtable lateral]
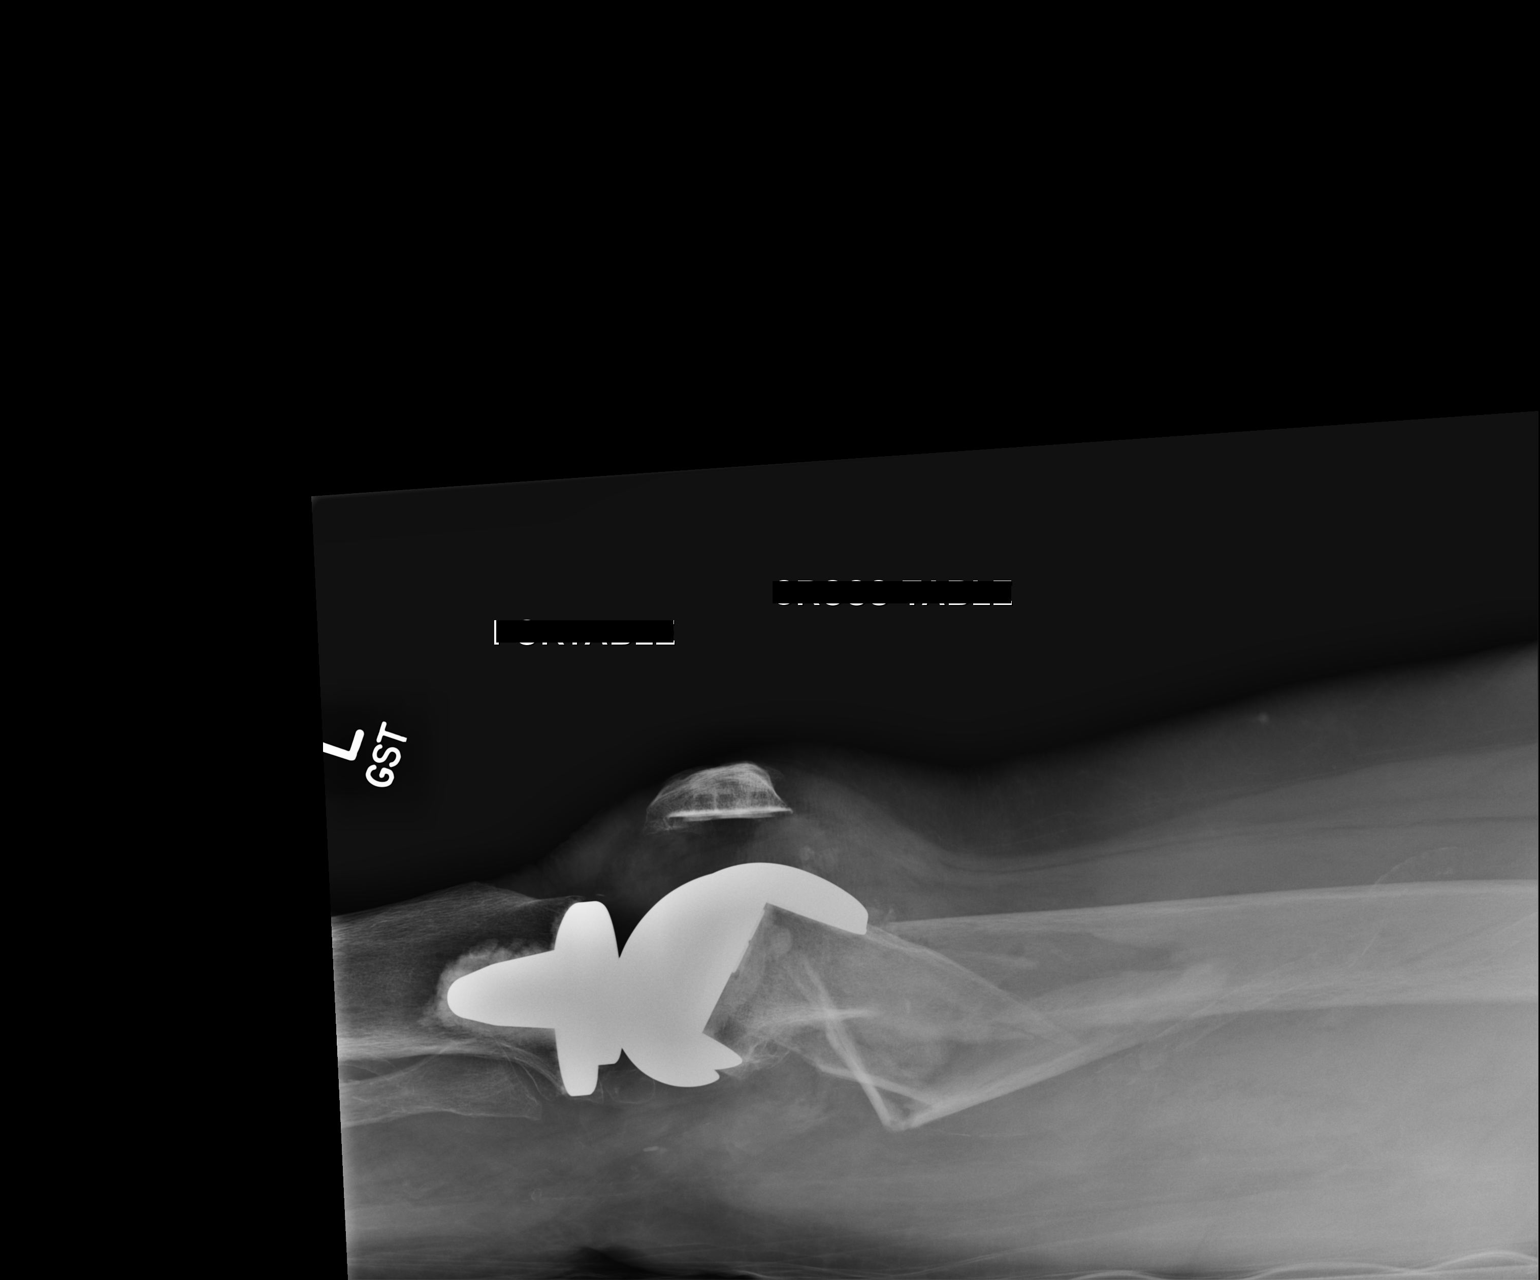

[2 of 2 positions shown; findings below may reference images not displayed]

FINDINGS: Acute comminuted and displaced fractures of the distal left radial
metaphysis extending to the base of the femoral component of a left
knee arthroplasty. See additional report of left knee same date.
Limited view of the proximal left femur appears intact. No gross
evidence of dislocation of the left hip. Vascular calcifications.
IMPRESSION: Acute comminuted and displaced fractures of the distal left radial
metaphysis. See additional report of left knee. Proximal left femur
is grossly intact.
